# Patient Record
Sex: Female | Born: 1935 | Race: White | Hispanic: No | Smoking: Former smoker
Health system: Southern US, Community
[De-identification: ages and names within clinical notes are randomized; demographics above are authoritative.]

## PROBLEM LIST (undated history)

## (undated) DIAGNOSIS — M1990 Unspecified osteoarthritis, unspecified site: Secondary | ICD-10-CM

## (undated) DIAGNOSIS — F419 Anxiety disorder, unspecified: Secondary | ICD-10-CM

## (undated) DIAGNOSIS — R112 Nausea with vomiting, unspecified: Secondary | ICD-10-CM

## (undated) DIAGNOSIS — M810 Age-related osteoporosis without current pathological fracture: Secondary | ICD-10-CM

## (undated) DIAGNOSIS — Z9889 Other specified postprocedural states: Secondary | ICD-10-CM

## (undated) DIAGNOSIS — K219 Gastro-esophageal reflux disease without esophagitis: Secondary | ICD-10-CM

## (undated) DIAGNOSIS — I10 Essential (primary) hypertension: Secondary | ICD-10-CM

## (undated) DIAGNOSIS — N390 Urinary tract infection, site not specified: Secondary | ICD-10-CM

## (undated) DIAGNOSIS — D649 Anemia, unspecified: Secondary | ICD-10-CM

## (undated) DIAGNOSIS — F32A Depression, unspecified: Secondary | ICD-10-CM

## (undated) DIAGNOSIS — I480 Paroxysmal atrial fibrillation: Secondary | ICD-10-CM

## (undated) DIAGNOSIS — I340 Nonrheumatic mitral (valve) insufficiency: Secondary | ICD-10-CM

## (undated) DIAGNOSIS — F329 Major depressive disorder, single episode, unspecified: Secondary | ICD-10-CM

## (undated) DIAGNOSIS — K5792 Diverticulitis of intestine, part unspecified, without perforation or abscess without bleeding: Secondary | ICD-10-CM

## (undated) HISTORY — PX: CHOLECYSTECTOMY: SHX55

## (undated) HISTORY — PX: HERNIA REPAIR: SHX51

## (undated) HISTORY — PX: APPENDECTOMY: SHX54

## (undated) HISTORY — PX: KYPHOPLASTY: SHX5884

## (undated) HISTORY — PX: KNEE ARTHROSCOPY W/ MENISCAL REPAIR: SHX1877

## (undated) HISTORY — PX: COLONOSCOPY: SHX174

## (undated) HISTORY — PX: GALLBLADDER SURGERY: SHX652

## (undated) HISTORY — PX: ABDOMINAL HYSTERECTOMY: SHX81

---

## 2013-11-24 DIAGNOSIS — M4850XA Collapsed vertebra, not elsewhere classified, site unspecified, initial encounter for fracture: Secondary | ICD-10-CM | POA: Insufficient documentation

## 2014-04-21 DIAGNOSIS — Z8719 Personal history of other diseases of the digestive system: Secondary | ICD-10-CM | POA: Insufficient documentation

## 2017-01-02 DIAGNOSIS — F4321 Adjustment disorder with depressed mood: Secondary | ICD-10-CM | POA: Diagnosis not present

## 2017-01-02 DIAGNOSIS — F331 Major depressive disorder, recurrent, moderate: Secondary | ICD-10-CM | POA: Diagnosis not present

## 2017-01-09 DIAGNOSIS — F332 Major depressive disorder, recurrent severe without psychotic features: Secondary | ICD-10-CM | POA: Diagnosis not present

## 2017-01-09 DIAGNOSIS — F411 Generalized anxiety disorder: Secondary | ICD-10-CM | POA: Diagnosis not present

## 2017-01-10 DIAGNOSIS — R300 Dysuria: Secondary | ICD-10-CM | POA: Diagnosis not present

## 2017-01-10 DIAGNOSIS — N390 Urinary tract infection, site not specified: Secondary | ICD-10-CM | POA: Diagnosis not present

## 2017-01-10 DIAGNOSIS — G8929 Other chronic pain: Secondary | ICD-10-CM | POA: Diagnosis not present

## 2017-01-10 DIAGNOSIS — Z87448 Personal history of other diseases of urinary system: Secondary | ICD-10-CM | POA: Diagnosis not present

## 2017-01-10 DIAGNOSIS — M79674 Pain in right toe(s): Secondary | ICD-10-CM | POA: Diagnosis not present

## 2017-01-10 DIAGNOSIS — L84 Corns and callosities: Secondary | ICD-10-CM | POA: Diagnosis not present

## 2017-01-23 DIAGNOSIS — F4321 Adjustment disorder with depressed mood: Secondary | ICD-10-CM | POA: Diagnosis not present

## 2017-01-23 DIAGNOSIS — F331 Major depressive disorder, recurrent, moderate: Secondary | ICD-10-CM | POA: Diagnosis not present

## 2017-01-24 DIAGNOSIS — R102 Pelvic and perineal pain: Secondary | ICD-10-CM | POA: Diagnosis not present

## 2017-01-24 DIAGNOSIS — R300 Dysuria: Secondary | ICD-10-CM | POA: Diagnosis not present

## 2017-02-13 DIAGNOSIS — F411 Generalized anxiety disorder: Secondary | ICD-10-CM | POA: Diagnosis not present

## 2017-02-13 DIAGNOSIS — F332 Major depressive disorder, recurrent severe without psychotic features: Secondary | ICD-10-CM | POA: Diagnosis not present

## 2017-02-20 DIAGNOSIS — F332 Major depressive disorder, recurrent severe without psychotic features: Secondary | ICD-10-CM | POA: Diagnosis not present

## 2017-02-20 DIAGNOSIS — F411 Generalized anxiety disorder: Secondary | ICD-10-CM | POA: Diagnosis not present

## 2017-02-24 DIAGNOSIS — J208 Acute bronchitis due to other specified organisms: Secondary | ICD-10-CM | POA: Diagnosis not present

## 2017-02-24 DIAGNOSIS — N390 Urinary tract infection, site not specified: Secondary | ICD-10-CM | POA: Diagnosis not present

## 2017-02-24 DIAGNOSIS — Z87440 Personal history of urinary (tract) infections: Secondary | ICD-10-CM | POA: Diagnosis not present

## 2017-02-27 DIAGNOSIS — M2041 Other hammer toe(s) (acquired), right foot: Secondary | ICD-10-CM | POA: Diagnosis not present

## 2017-02-27 DIAGNOSIS — M79672 Pain in left foot: Secondary | ICD-10-CM | POA: Diagnosis not present

## 2017-02-27 DIAGNOSIS — M2042 Other hammer toe(s) (acquired), left foot: Secondary | ICD-10-CM | POA: Diagnosis not present

## 2017-02-27 DIAGNOSIS — L84 Corns and callosities: Secondary | ICD-10-CM | POA: Diagnosis not present

## 2017-03-02 DIAGNOSIS — T148XXA Other injury of unspecified body region, initial encounter: Secondary | ICD-10-CM | POA: Diagnosis not present

## 2017-03-02 DIAGNOSIS — R109 Unspecified abdominal pain: Secondary | ICD-10-CM | POA: Diagnosis not present

## 2017-03-02 DIAGNOSIS — M545 Low back pain: Secondary | ICD-10-CM | POA: Diagnosis not present

## 2017-03-03 DIAGNOSIS — M549 Dorsalgia, unspecified: Secondary | ICD-10-CM | POA: Diagnosis not present

## 2017-03-03 DIAGNOSIS — R05 Cough: Secondary | ICD-10-CM | POA: Diagnosis not present

## 2017-03-03 DIAGNOSIS — M546 Pain in thoracic spine: Secondary | ICD-10-CM | POA: Diagnosis not present

## 2017-03-11 DIAGNOSIS — I722 Aneurysm of renal artery: Secondary | ICD-10-CM | POA: Diagnosis not present

## 2017-03-11 DIAGNOSIS — R1084 Generalized abdominal pain: Secondary | ICD-10-CM | POA: Diagnosis not present

## 2017-03-11 DIAGNOSIS — R300 Dysuria: Secondary | ICD-10-CM | POA: Diagnosis not present

## 2017-03-11 DIAGNOSIS — R071 Chest pain on breathing: Secondary | ICD-10-CM | POA: Diagnosis not present

## 2017-03-11 DIAGNOSIS — M546 Pain in thoracic spine: Secondary | ICD-10-CM | POA: Diagnosis not present

## 2017-03-13 DIAGNOSIS — F331 Major depressive disorder, recurrent, moderate: Secondary | ICD-10-CM | POA: Diagnosis not present

## 2017-03-13 DIAGNOSIS — F4321 Adjustment disorder with depressed mood: Secondary | ICD-10-CM | POA: Diagnosis not present

## 2017-03-17 DIAGNOSIS — K2970 Gastritis, unspecified, without bleeding: Secondary | ICD-10-CM | POA: Diagnosis not present

## 2017-03-17 DIAGNOSIS — D649 Anemia, unspecified: Secondary | ICD-10-CM | POA: Diagnosis not present

## 2017-03-17 DIAGNOSIS — D500 Iron deficiency anemia secondary to blood loss (chronic): Secondary | ICD-10-CM | POA: Diagnosis not present

## 2017-03-17 DIAGNOSIS — K2950 Unspecified chronic gastritis without bleeding: Secondary | ICD-10-CM | POA: Diagnosis not present

## 2017-03-17 DIAGNOSIS — K2960 Other gastritis without bleeding: Secondary | ICD-10-CM | POA: Diagnosis not present

## 2017-03-17 DIAGNOSIS — K219 Gastro-esophageal reflux disease without esophagitis: Secondary | ICD-10-CM | POA: Diagnosis not present

## 2017-03-17 DIAGNOSIS — K449 Diaphragmatic hernia without obstruction or gangrene: Secondary | ICD-10-CM | POA: Diagnosis not present

## 2017-03-17 DIAGNOSIS — R109 Unspecified abdominal pain: Secondary | ICD-10-CM | POA: Diagnosis not present

## 2017-03-17 DIAGNOSIS — E785 Hyperlipidemia, unspecified: Secondary | ICD-10-CM | POA: Diagnosis not present

## 2017-03-17 DIAGNOSIS — R1084 Generalized abdominal pain: Secondary | ICD-10-CM | POA: Diagnosis not present

## 2017-03-20 DIAGNOSIS — S22060A Wedge compression fracture of T7-T8 vertebra, initial encounter for closed fracture: Secondary | ICD-10-CM | POA: Diagnosis not present

## 2017-03-21 DIAGNOSIS — S22060A Wedge compression fracture of T7-T8 vertebra, initial encounter for closed fracture: Secondary | ICD-10-CM | POA: Diagnosis not present

## 2017-03-21 DIAGNOSIS — M4854XA Collapsed vertebra, not elsewhere classified, thoracic region, initial encounter for fracture: Secondary | ICD-10-CM | POA: Diagnosis not present

## 2017-03-24 DIAGNOSIS — N189 Chronic kidney disease, unspecified: Secondary | ICD-10-CM | POA: Diagnosis not present

## 2017-03-24 DIAGNOSIS — D649 Anemia, unspecified: Secondary | ICD-10-CM | POA: Diagnosis not present

## 2017-03-24 DIAGNOSIS — I129 Hypertensive chronic kidney disease with stage 1 through stage 4 chronic kidney disease, or unspecified chronic kidney disease: Secondary | ICD-10-CM | POA: Diagnosis not present

## 2017-03-24 DIAGNOSIS — D631 Anemia in chronic kidney disease: Secondary | ICD-10-CM | POA: Diagnosis not present

## 2017-03-24 DIAGNOSIS — M8000XD Age-related osteoporosis with current pathological fracture, unspecified site, subsequent encounter for fracture with routine healing: Secondary | ICD-10-CM | POA: Insufficient documentation

## 2017-03-24 DIAGNOSIS — M8008XA Age-related osteoporosis with current pathological fracture, vertebra(e), initial encounter for fracture: Secondary | ICD-10-CM | POA: Diagnosis not present

## 2017-03-24 DIAGNOSIS — E785 Hyperlipidemia, unspecified: Secondary | ICD-10-CM | POA: Diagnosis not present

## 2017-03-24 DIAGNOSIS — Z87891 Personal history of nicotine dependence: Secondary | ICD-10-CM | POA: Diagnosis not present

## 2017-03-28 DIAGNOSIS — R0781 Pleurodynia: Secondary | ICD-10-CM | POA: Diagnosis not present

## 2017-03-28 DIAGNOSIS — J189 Pneumonia, unspecified organism: Secondary | ICD-10-CM | POA: Diagnosis not present

## 2017-04-07 DIAGNOSIS — F329 Major depressive disorder, single episode, unspecified: Secondary | ICD-10-CM | POA: Diagnosis not present

## 2017-04-07 DIAGNOSIS — Z9049 Acquired absence of other specified parts of digestive tract: Secondary | ICD-10-CM | POA: Diagnosis not present

## 2017-04-07 DIAGNOSIS — F419 Anxiety disorder, unspecified: Secondary | ICD-10-CM | POA: Diagnosis not present

## 2017-04-07 DIAGNOSIS — M19012 Primary osteoarthritis, left shoulder: Secondary | ICD-10-CM | POA: Diagnosis not present

## 2017-04-07 DIAGNOSIS — M1990 Unspecified osteoarthritis, unspecified site: Secondary | ICD-10-CM | POA: Diagnosis not present

## 2017-04-07 DIAGNOSIS — S46912A Strain of unspecified muscle, fascia and tendon at shoulder and upper arm level, left arm, initial encounter: Secondary | ICD-10-CM | POA: Diagnosis not present

## 2017-04-07 DIAGNOSIS — M1711 Unilateral primary osteoarthritis, right knee: Secondary | ICD-10-CM | POA: Diagnosis not present

## 2017-04-07 DIAGNOSIS — Z90710 Acquired absence of both cervix and uterus: Secondary | ICD-10-CM | POA: Diagnosis not present

## 2017-04-07 DIAGNOSIS — S8011XA Contusion of right lower leg, initial encounter: Secondary | ICD-10-CM | POA: Diagnosis not present

## 2017-04-07 DIAGNOSIS — D649 Anemia, unspecified: Secondary | ICD-10-CM | POA: Diagnosis not present

## 2017-04-07 DIAGNOSIS — Z79899 Other long term (current) drug therapy: Secondary | ICD-10-CM | POA: Diagnosis not present

## 2017-04-07 DIAGNOSIS — S80211A Abrasion, right knee, initial encounter: Secondary | ICD-10-CM | POA: Diagnosis not present

## 2017-04-07 DIAGNOSIS — S80811A Abrasion, right lower leg, initial encounter: Secondary | ICD-10-CM | POA: Diagnosis not present

## 2017-04-07 DIAGNOSIS — Z79891 Long term (current) use of opiate analgesic: Secondary | ICD-10-CM | POA: Diagnosis not present

## 2017-04-07 DIAGNOSIS — K219 Gastro-esophageal reflux disease without esophagitis: Secondary | ICD-10-CM | POA: Diagnosis not present

## 2017-04-07 DIAGNOSIS — Z9089 Acquired absence of other organs: Secondary | ICD-10-CM | POA: Diagnosis not present

## 2017-04-07 DIAGNOSIS — Z87891 Personal history of nicotine dependence: Secondary | ICD-10-CM | POA: Diagnosis not present

## 2017-04-07 DIAGNOSIS — W19XXXA Unspecified fall, initial encounter: Secondary | ICD-10-CM | POA: Diagnosis not present

## 2017-04-10 DIAGNOSIS — F331 Major depressive disorder, recurrent, moderate: Secondary | ICD-10-CM | POA: Diagnosis not present

## 2017-04-10 DIAGNOSIS — F4321 Adjustment disorder with depressed mood: Secondary | ICD-10-CM | POA: Diagnosis not present

## 2017-04-14 DIAGNOSIS — Z842 Family history of other diseases of the genitourinary system: Secondary | ICD-10-CM | POA: Diagnosis not present

## 2017-04-14 DIAGNOSIS — R0602 Shortness of breath: Secondary | ICD-10-CM | POA: Diagnosis not present

## 2017-04-14 DIAGNOSIS — K449 Diaphragmatic hernia without obstruction or gangrene: Secondary | ICD-10-CM | POA: Diagnosis not present

## 2017-04-14 DIAGNOSIS — D509 Iron deficiency anemia, unspecified: Secondary | ICD-10-CM | POA: Diagnosis present

## 2017-04-14 DIAGNOSIS — I313 Pericardial effusion (noninflammatory): Secondary | ICD-10-CM | POA: Diagnosis not present

## 2017-04-14 DIAGNOSIS — F419 Anxiety disorder, unspecified: Secondary | ICD-10-CM | POA: Diagnosis present

## 2017-04-14 DIAGNOSIS — Z87440 Personal history of urinary (tract) infections: Secondary | ICD-10-CM | POA: Diagnosis not present

## 2017-04-14 DIAGNOSIS — I4891 Unspecified atrial fibrillation: Secondary | ICD-10-CM | POA: Diagnosis not present

## 2017-04-14 DIAGNOSIS — Z8249 Family history of ischemic heart disease and other diseases of the circulatory system: Secondary | ICD-10-CM | POA: Diagnosis not present

## 2017-04-14 DIAGNOSIS — Z9181 History of falling: Secondary | ICD-10-CM | POA: Diagnosis not present

## 2017-04-14 DIAGNOSIS — I517 Cardiomegaly: Secondary | ICD-10-CM | POA: Diagnosis not present

## 2017-04-14 DIAGNOSIS — Z87891 Personal history of nicotine dependence: Secondary | ICD-10-CM | POA: Diagnosis not present

## 2017-04-14 DIAGNOSIS — D539 Nutritional anemia, unspecified: Secondary | ICD-10-CM | POA: Diagnosis not present

## 2017-04-14 DIAGNOSIS — R0789 Other chest pain: Secondary | ICD-10-CM | POA: Diagnosis not present

## 2017-04-14 DIAGNOSIS — I10 Essential (primary) hypertension: Secondary | ICD-10-CM | POA: Insufficient documentation

## 2017-04-14 DIAGNOSIS — F329 Major depressive disorder, single episode, unspecified: Secondary | ICD-10-CM | POA: Diagnosis not present

## 2017-04-14 DIAGNOSIS — R000 Tachycardia, unspecified: Secondary | ICD-10-CM | POA: Diagnosis not present

## 2017-04-14 DIAGNOSIS — I480 Paroxysmal atrial fibrillation: Secondary | ICD-10-CM | POA: Diagnosis present

## 2017-04-14 DIAGNOSIS — R410 Disorientation, unspecified: Secondary | ICD-10-CM | POA: Diagnosis not present

## 2017-04-14 DIAGNOSIS — R109 Unspecified abdominal pain: Secondary | ICD-10-CM | POA: Diagnosis not present

## 2017-04-14 DIAGNOSIS — M791 Myalgia: Secondary | ICD-10-CM | POA: Diagnosis present

## 2017-04-14 DIAGNOSIS — I083 Combined rheumatic disorders of mitral, aortic and tricuspid valves: Secondary | ICD-10-CM | POA: Diagnosis not present

## 2017-04-14 DIAGNOSIS — J810 Acute pulmonary edema: Secondary | ICD-10-CM | POA: Diagnosis present

## 2017-04-14 DIAGNOSIS — I9589 Other hypotension: Secondary | ICD-10-CM | POA: Diagnosis present

## 2017-04-14 DIAGNOSIS — I5189 Other ill-defined heart diseases: Secondary | ICD-10-CM | POA: Diagnosis present

## 2017-04-14 DIAGNOSIS — R300 Dysuria: Secondary | ICD-10-CM | POA: Diagnosis present

## 2017-04-14 DIAGNOSIS — N390 Urinary tract infection, site not specified: Secondary | ICD-10-CM | POA: Diagnosis not present

## 2017-04-14 DIAGNOSIS — E8770 Fluid overload, unspecified: Secondary | ICD-10-CM | POA: Diagnosis present

## 2017-04-14 DIAGNOSIS — J90 Pleural effusion, not elsewhere classified: Secondary | ICD-10-CM | POA: Diagnosis not present

## 2017-04-14 DIAGNOSIS — J9601 Acute respiratory failure with hypoxia: Secondary | ICD-10-CM | POA: Diagnosis not present

## 2017-04-14 DIAGNOSIS — K5730 Diverticulosis of large intestine without perforation or abscess without bleeding: Secondary | ICD-10-CM | POA: Diagnosis not present

## 2017-04-14 DIAGNOSIS — K219 Gastro-esophageal reflux disease without esophagitis: Secondary | ICD-10-CM | POA: Diagnosis present

## 2017-04-14 DIAGNOSIS — I77819 Aortic ectasia, unspecified site: Secondary | ICD-10-CM | POA: Diagnosis not present

## 2017-04-14 DIAGNOSIS — I519 Heart disease, unspecified: Secondary | ICD-10-CM | POA: Diagnosis not present

## 2017-04-14 DIAGNOSIS — J9811 Atelectasis: Secondary | ICD-10-CM | POA: Diagnosis not present

## 2017-04-14 DIAGNOSIS — S80811A Abrasion, right lower leg, initial encounter: Secondary | ICD-10-CM | POA: Diagnosis present

## 2017-04-14 DIAGNOSIS — I340 Nonrheumatic mitral (valve) insufficiency: Secondary | ICD-10-CM | POA: Diagnosis present

## 2017-04-14 DIAGNOSIS — I959 Hypotension, unspecified: Secondary | ICD-10-CM | POA: Diagnosis not present

## 2017-04-20 DIAGNOSIS — M6281 Muscle weakness (generalized): Secondary | ICD-10-CM | POA: Diagnosis not present

## 2017-04-20 DIAGNOSIS — I480 Paroxysmal atrial fibrillation: Secondary | ICD-10-CM | POA: Diagnosis not present

## 2017-04-20 DIAGNOSIS — M1990 Unspecified osteoarthritis, unspecified site: Secondary | ICD-10-CM | POA: Diagnosis not present

## 2017-04-20 DIAGNOSIS — D649 Anemia, unspecified: Secondary | ICD-10-CM | POA: Diagnosis not present

## 2017-04-20 DIAGNOSIS — R2681 Unsteadiness on feet: Secondary | ICD-10-CM | POA: Diagnosis not present

## 2017-04-20 DIAGNOSIS — F329 Major depressive disorder, single episode, unspecified: Secondary | ICD-10-CM | POA: Diagnosis not present

## 2017-04-25 DIAGNOSIS — D649 Anemia, unspecified: Secondary | ICD-10-CM | POA: Diagnosis not present

## 2017-04-25 DIAGNOSIS — M6281 Muscle weakness (generalized): Secondary | ICD-10-CM | POA: Diagnosis not present

## 2017-04-25 DIAGNOSIS — M1990 Unspecified osteoarthritis, unspecified site: Secondary | ICD-10-CM | POA: Diagnosis not present

## 2017-04-25 DIAGNOSIS — F329 Major depressive disorder, single episode, unspecified: Secondary | ICD-10-CM | POA: Diagnosis not present

## 2017-04-25 DIAGNOSIS — I480 Paroxysmal atrial fibrillation: Secondary | ICD-10-CM | POA: Diagnosis not present

## 2017-04-25 DIAGNOSIS — R2681 Unsteadiness on feet: Secondary | ICD-10-CM | POA: Diagnosis not present

## 2017-04-30 DIAGNOSIS — M6281 Muscle weakness (generalized): Secondary | ICD-10-CM | POA: Diagnosis not present

## 2017-04-30 DIAGNOSIS — F329 Major depressive disorder, single episode, unspecified: Secondary | ICD-10-CM | POA: Diagnosis not present

## 2017-04-30 DIAGNOSIS — I480 Paroxysmal atrial fibrillation: Secondary | ICD-10-CM | POA: Diagnosis not present

## 2017-04-30 DIAGNOSIS — M1990 Unspecified osteoarthritis, unspecified site: Secondary | ICD-10-CM | POA: Diagnosis not present

## 2017-04-30 DIAGNOSIS — D649 Anemia, unspecified: Secondary | ICD-10-CM | POA: Diagnosis not present

## 2017-04-30 DIAGNOSIS — R2681 Unsteadiness on feet: Secondary | ICD-10-CM | POA: Diagnosis not present

## 2017-05-01 DIAGNOSIS — F332 Major depressive disorder, recurrent severe without psychotic features: Secondary | ICD-10-CM | POA: Diagnosis not present

## 2017-05-01 DIAGNOSIS — F411 Generalized anxiety disorder: Secondary | ICD-10-CM | POA: Diagnosis not present

## 2017-05-02 DIAGNOSIS — F329 Major depressive disorder, single episode, unspecified: Secondary | ICD-10-CM | POA: Diagnosis not present

## 2017-05-02 DIAGNOSIS — M6281 Muscle weakness (generalized): Secondary | ICD-10-CM | POA: Diagnosis not present

## 2017-05-02 DIAGNOSIS — M1990 Unspecified osteoarthritis, unspecified site: Secondary | ICD-10-CM | POA: Diagnosis not present

## 2017-05-02 DIAGNOSIS — D649 Anemia, unspecified: Secondary | ICD-10-CM | POA: Diagnosis not present

## 2017-05-02 DIAGNOSIS — I480 Paroxysmal atrial fibrillation: Secondary | ICD-10-CM | POA: Diagnosis not present

## 2017-05-02 DIAGNOSIS — R2681 Unsteadiness on feet: Secondary | ICD-10-CM | POA: Diagnosis not present

## 2017-05-06 DIAGNOSIS — R195 Other fecal abnormalities: Secondary | ICD-10-CM | POA: Diagnosis not present

## 2017-05-06 DIAGNOSIS — D500 Iron deficiency anemia secondary to blood loss (chronic): Secondary | ICD-10-CM | POA: Diagnosis not present

## 2017-05-06 DIAGNOSIS — R634 Abnormal weight loss: Secondary | ICD-10-CM | POA: Diagnosis not present

## 2017-05-06 DIAGNOSIS — Z09 Encounter for follow-up examination after completed treatment for conditions other than malignant neoplasm: Secondary | ICD-10-CM | POA: Diagnosis not present

## 2017-05-06 DIAGNOSIS — R7989 Other specified abnormal findings of blood chemistry: Secondary | ICD-10-CM | POA: Diagnosis not present

## 2017-05-08 DIAGNOSIS — F332 Major depressive disorder, recurrent severe without psychotic features: Secondary | ICD-10-CM | POA: Diagnosis not present

## 2017-05-09 DIAGNOSIS — I480 Paroxysmal atrial fibrillation: Secondary | ICD-10-CM | POA: Diagnosis not present

## 2017-05-09 DIAGNOSIS — F329 Major depressive disorder, single episode, unspecified: Secondary | ICD-10-CM | POA: Diagnosis not present

## 2017-05-09 DIAGNOSIS — M6281 Muscle weakness (generalized): Secondary | ICD-10-CM | POA: Diagnosis not present

## 2017-05-09 DIAGNOSIS — D649 Anemia, unspecified: Secondary | ICD-10-CM | POA: Diagnosis not present

## 2017-05-09 DIAGNOSIS — M1990 Unspecified osteoarthritis, unspecified site: Secondary | ICD-10-CM | POA: Diagnosis not present

## 2017-05-09 DIAGNOSIS — R2681 Unsteadiness on feet: Secondary | ICD-10-CM | POA: Diagnosis not present

## 2017-05-13 DIAGNOSIS — I480 Paroxysmal atrial fibrillation: Secondary | ICD-10-CM | POA: Diagnosis not present

## 2017-05-13 DIAGNOSIS — D649 Anemia, unspecified: Secondary | ICD-10-CM | POA: Diagnosis not present

## 2017-05-13 DIAGNOSIS — F329 Major depressive disorder, single episode, unspecified: Secondary | ICD-10-CM | POA: Diagnosis not present

## 2017-05-13 DIAGNOSIS — M6281 Muscle weakness (generalized): Secondary | ICD-10-CM | POA: Diagnosis not present

## 2017-05-13 DIAGNOSIS — R2681 Unsteadiness on feet: Secondary | ICD-10-CM | POA: Diagnosis not present

## 2017-05-13 DIAGNOSIS — M1990 Unspecified osteoarthritis, unspecified site: Secondary | ICD-10-CM | POA: Diagnosis not present

## 2017-05-15 DIAGNOSIS — F411 Generalized anxiety disorder: Secondary | ICD-10-CM | POA: Diagnosis not present

## 2017-05-15 DIAGNOSIS — F332 Major depressive disorder, recurrent severe without psychotic features: Secondary | ICD-10-CM | POA: Diagnosis not present

## 2017-05-16 DIAGNOSIS — I480 Paroxysmal atrial fibrillation: Secondary | ICD-10-CM | POA: Diagnosis not present

## 2017-05-16 DIAGNOSIS — F329 Major depressive disorder, single episode, unspecified: Secondary | ICD-10-CM | POA: Diagnosis not present

## 2017-05-16 DIAGNOSIS — M6281 Muscle weakness (generalized): Secondary | ICD-10-CM | POA: Diagnosis not present

## 2017-05-16 DIAGNOSIS — D649 Anemia, unspecified: Secondary | ICD-10-CM | POA: Diagnosis not present

## 2017-05-16 DIAGNOSIS — M1990 Unspecified osteoarthritis, unspecified site: Secondary | ICD-10-CM | POA: Diagnosis not present

## 2017-05-16 DIAGNOSIS — R2681 Unsteadiness on feet: Secondary | ICD-10-CM | POA: Diagnosis not present

## 2017-05-19 DIAGNOSIS — F329 Major depressive disorder, single episode, unspecified: Secondary | ICD-10-CM | POA: Diagnosis not present

## 2017-05-19 DIAGNOSIS — M1990 Unspecified osteoarthritis, unspecified site: Secondary | ICD-10-CM | POA: Diagnosis not present

## 2017-05-19 DIAGNOSIS — I480 Paroxysmal atrial fibrillation: Secondary | ICD-10-CM | POA: Diagnosis not present

## 2017-05-19 DIAGNOSIS — M6281 Muscle weakness (generalized): Secondary | ICD-10-CM | POA: Diagnosis not present

## 2017-05-19 DIAGNOSIS — R2681 Unsteadiness on feet: Secondary | ICD-10-CM | POA: Diagnosis not present

## 2017-05-19 DIAGNOSIS — D649 Anemia, unspecified: Secondary | ICD-10-CM | POA: Diagnosis not present

## 2017-05-22 DIAGNOSIS — F411 Generalized anxiety disorder: Secondary | ICD-10-CM | POA: Diagnosis not present

## 2017-05-22 DIAGNOSIS — F332 Major depressive disorder, recurrent severe without psychotic features: Secondary | ICD-10-CM | POA: Diagnosis not present

## 2017-05-23 DIAGNOSIS — M6281 Muscle weakness (generalized): Secondary | ICD-10-CM | POA: Diagnosis not present

## 2017-05-23 DIAGNOSIS — R2681 Unsteadiness on feet: Secondary | ICD-10-CM | POA: Diagnosis not present

## 2017-05-23 DIAGNOSIS — M1990 Unspecified osteoarthritis, unspecified site: Secondary | ICD-10-CM | POA: Diagnosis not present

## 2017-05-23 DIAGNOSIS — I480 Paroxysmal atrial fibrillation: Secondary | ICD-10-CM | POA: Diagnosis not present

## 2017-05-23 DIAGNOSIS — F329 Major depressive disorder, single episode, unspecified: Secondary | ICD-10-CM | POA: Diagnosis not present

## 2017-05-23 DIAGNOSIS — D649 Anemia, unspecified: Secondary | ICD-10-CM | POA: Diagnosis not present

## 2017-05-28 DIAGNOSIS — R21 Rash and other nonspecific skin eruption: Secondary | ICD-10-CM | POA: Diagnosis not present

## 2017-06-05 DIAGNOSIS — F411 Generalized anxiety disorder: Secondary | ICD-10-CM | POA: Diagnosis not present

## 2017-06-05 DIAGNOSIS — F332 Major depressive disorder, recurrent severe without psychotic features: Secondary | ICD-10-CM | POA: Diagnosis not present

## 2017-06-12 DIAGNOSIS — F332 Major depressive disorder, recurrent severe without psychotic features: Secondary | ICD-10-CM | POA: Diagnosis not present

## 2017-06-12 DIAGNOSIS — F411 Generalized anxiety disorder: Secondary | ICD-10-CM | POA: Diagnosis not present

## 2017-06-19 DIAGNOSIS — F332 Major depressive disorder, recurrent severe without psychotic features: Secondary | ICD-10-CM | POA: Diagnosis not present

## 2017-06-19 DIAGNOSIS — F411 Generalized anxiety disorder: Secondary | ICD-10-CM | POA: Diagnosis not present

## 2017-06-26 DIAGNOSIS — F332 Major depressive disorder, recurrent severe without psychotic features: Secondary | ICD-10-CM | POA: Diagnosis not present

## 2017-06-26 DIAGNOSIS — F411 Generalized anxiety disorder: Secondary | ICD-10-CM | POA: Diagnosis not present

## 2017-07-10 DIAGNOSIS — F332 Major depressive disorder, recurrent severe without psychotic features: Secondary | ICD-10-CM | POA: Diagnosis not present

## 2017-07-10 DIAGNOSIS — F411 Generalized anxiety disorder: Secondary | ICD-10-CM | POA: Diagnosis not present

## 2017-07-22 DIAGNOSIS — M25552 Pain in left hip: Secondary | ICD-10-CM | POA: Diagnosis not present

## 2017-07-22 DIAGNOSIS — M533 Sacrococcygeal disorders, not elsewhere classified: Secondary | ICD-10-CM | POA: Diagnosis not present

## 2017-07-24 DIAGNOSIS — M533 Sacrococcygeal disorders, not elsewhere classified: Secondary | ICD-10-CM | POA: Diagnosis not present

## 2017-07-24 DIAGNOSIS — M25451 Effusion, right hip: Secondary | ICD-10-CM | POA: Diagnosis not present

## 2017-07-24 DIAGNOSIS — M25452 Effusion, left hip: Secondary | ICD-10-CM | POA: Diagnosis not present

## 2017-07-24 DIAGNOSIS — M791 Myalgia: Secondary | ICD-10-CM | POA: Diagnosis not present

## 2017-07-28 DIAGNOSIS — F418 Other specified anxiety disorders: Secondary | ICD-10-CM | POA: Diagnosis not present

## 2017-07-28 DIAGNOSIS — M533 Sacrococcygeal disorders, not elsewhere classified: Secondary | ICD-10-CM | POA: Diagnosis not present

## 2017-07-28 DIAGNOSIS — I10 Essential (primary) hypertension: Secondary | ICD-10-CM | POA: Diagnosis not present

## 2017-08-11 ENCOUNTER — Encounter: Payer: Self-pay | Admitting: Sports Medicine

## 2017-08-11 ENCOUNTER — Ambulatory Visit (INDEPENDENT_AMBULATORY_CARE_PROVIDER_SITE_OTHER): Payer: Medicare Other | Admitting: Sports Medicine

## 2017-08-11 DIAGNOSIS — G8929 Other chronic pain: Secondary | ICD-10-CM

## 2017-08-11 DIAGNOSIS — M545 Low back pain: Secondary | ICD-10-CM

## 2017-08-11 DIAGNOSIS — M48061 Spinal stenosis, lumbar region without neurogenic claudication: Secondary | ICD-10-CM | POA: Insufficient documentation

## 2017-08-11 DIAGNOSIS — E1321 Other specified diabetes mellitus with diabetic nephropathy: Secondary | ICD-10-CM | POA: Diagnosis not present

## 2017-08-11 DIAGNOSIS — D649 Anemia, unspecified: Secondary | ICD-10-CM

## 2017-08-11 MED ORDER — MELOXICAM 15 MG PO TABS: ORAL_TABLET | ORAL | 3 refills | Status: DC

## 2017-08-11 NOTE — Progress Notes (Signed)
   Subjective:    I'm seeing this patient as a consultation for:  Earley Brooke PA-C  CC: Left buttock pain  HPI: This is a pleasant 83 year old female, for months she's had pain in her left lower buttock. Does have a history of osteoporosis and had several vertebral compression fractures, her PCP obtain an MRI of the pelvis which was negative with the exception of seeing a hint of lumbar spinal stenosis as well as a nonspecific cyst in the femoral head/neck junction. Pain is worse with standing, walking. Localized over the SI joint with radiation into the buttock but no pain over the ischial tuberosity. Nothing overtly radicular. No bowel or bladder dysfunction that are new, no saddle numbness or constitutional symptoms.  On review of her chart she does seem to have some fairly severe anemia, normocytic, she is on iron.  Past medical history, Surgical history, Family history not pertinant except as noted below, Social history, Allergies, and medications have been entered into the medical record, reviewed, and no changes needed.   Review of Systems: No headache, visual changes, nausea, vomiting, diarrhea, constipation, dizziness, abdominal pain, skin rash, fevers, chills, night sweats, weight loss, swollen lymph nodes, body aches, joint swelling, muscle aches, chest pain, shortness of breath, mood changes, visual or auditory hallucinations.   Objective:    General: Well Developed, well nourished, and in no acute distress.  Neuro:  Extra-ocular muscles intact, able to move all 4 extremities, sensation grossly intact.  Deep tendon reflexes tested were normal. Psych: Alert and oriented, mood congruent with affect. ENT:  Ears and nose appear unremarkable.  Hearing grossly normal. Neck: Unremarkable overall appearance, trachea midline.  No visible thyroid enlargement. Eyes: Conjunctivae and lids appear unremarkable.  Pupils equal and round. Skin: Warm and dry, no rashes noted.  Cardiovascular:  Pulses palpable, no extremity edema. Back Exam:  Inspection: Unremarkable  Motion: Flexion 45 deg, Extension 45 deg, Side Bending to 45 deg bilaterally,  Rotation to 45 deg bilaterally  SLR laying: Negative  XSLR laying: Negative  Palpable tenderness: Left sacroiliac joint, no tenderness over the ischial tuberosity. FABER: negative. Sensory change: Gross sensation intact to all lumbar and sacral dermatomes.  Reflexes: 2+ at both patellar tendons, 2+ at achilles tendons, Babinski's downgoing.  Strength at foot  Plantar-flexion: 5/5 Dorsi-flexion: 5/5 Eversion: 5/5 Inversion: 5/5  Leg strength  Quad: 5/5 Hamstring: 5/5 Hip flexor: 5/5 Hip abductors: 5/5  Gait unremarkable.  Impression and Recommendations:   This case required medical decision making of moderate complexity.  Left low back pain Pelvic MRI by another provider was negative for the most part. There was some lumbar spinal stenosis in the lower lumbar spine. Pain is predominantly around the left sacroiliac joint. She also has some pain consistent with spinal stenosis type pain. Has not done formal physical therapy or taking an NSAID. Adding meloxicam, home health PT, return in one month, SI joint injection if no better. If still no improvement we will image her lumbar spine. She also has fairly severe anemia which I think is likely contributory.  Normocytic anemia Unclear etiology, she is taking iron but she does have a normal MCV. Rechecking CBC, anemia panel, reticulocyte counts.

## 2017-08-11 NOTE — Assessment & Plan Note (Addendum)
Pelvic MRI by another provider was negative for the most part. There was some lumbar spinal stenosis in the lower lumbar spine. Pain is predominantly around the left sacroiliac joint. She also has some pain consistent with spinal stenosis type pain. Has not done formal physical therapy or taking an NSAID. Adding meloxicam, home health PT, return in one month, SI joint injection if no better. If still no improvement we will image her lumbar spine. She also has fairly severe anemia which I think is likely contributory.

## 2017-08-11 NOTE — Assessment & Plan Note (Signed)
Unclear etiology, she is taking iron but she does have a normal MCV. Rechecking CBC, anemia panel, reticulocyte counts.

## 2017-08-12 DIAGNOSIS — R300 Dysuria: Secondary | ICD-10-CM | POA: Diagnosis not present

## 2017-08-12 DIAGNOSIS — R05 Cough: Secondary | ICD-10-CM | POA: Diagnosis not present

## 2017-08-12 LAB — CBC
HCT: 36.5 % (ref 35.0–45.0)
Hemoglobin: 12.3 g/dL (ref 11.7–15.5)
MCH: 28.9 pg (ref 27.0–33.0)
MCHC: 33.7 g/dL (ref 32.0–36.0)
MCV: 85.7 fL (ref 80.0–100.0)
MPV: 9.3 fL (ref 7.5–12.5)
Platelets: 298 K/uL (ref 140–400)
RBC: 4.26 MIL/uL (ref 3.80–5.10)
RDW: 15.7 % — ABNORMAL HIGH (ref 11.0–15.0)
WBC: 6.9 K/uL (ref 3.8–10.8)

## 2017-08-12 LAB — FERRITIN: Ferritin: 66 ng/mL (ref 20–288)

## 2017-08-12 LAB — FOLATE: Folate: 14.6 ng/mL (ref >5.4)

## 2017-08-12 LAB — IRON AND TIBC
%SAT: 19 % (ref 11–50)
Iron: 56 ug/dL (ref 45–160)
TIBC: 299 ug/dL (ref 250–450)
UIBC: 243 ug/dL

## 2017-08-12 LAB — VITAMIN B12: Vitamin B-12: 892 pg/mL (ref 200–1100)

## 2017-08-12 LAB — RETICULOCYTES
ABS Retic: 34080 {cells}/uL (ref 20000–80000)
RBC.: 4.26 MIL/uL (ref 3.80–5.10)
Retic Ct Pct: 0.8 %

## 2017-08-15 ENCOUNTER — Encounter: Payer: Self-pay | Admitting: Sports Medicine

## 2017-08-21 DIAGNOSIS — F411 Generalized anxiety disorder: Secondary | ICD-10-CM | POA: Diagnosis not present

## 2017-08-21 DIAGNOSIS — F332 Major depressive disorder, recurrent severe without psychotic features: Secondary | ICD-10-CM | POA: Diagnosis not present

## 2017-08-24 DIAGNOSIS — R350 Frequency of micturition: Secondary | ICD-10-CM | POA: Diagnosis not present

## 2017-08-28 DIAGNOSIS — F411 Generalized anxiety disorder: Secondary | ICD-10-CM | POA: Diagnosis not present

## 2017-08-28 DIAGNOSIS — F332 Major depressive disorder, recurrent severe without psychotic features: Secondary | ICD-10-CM | POA: Diagnosis not present

## 2017-09-04 DIAGNOSIS — F411 Generalized anxiety disorder: Secondary | ICD-10-CM | POA: Diagnosis not present

## 2017-09-04 DIAGNOSIS — F332 Major depressive disorder, recurrent severe without psychotic features: Secondary | ICD-10-CM | POA: Diagnosis not present

## 2017-09-06 DIAGNOSIS — N3001 Acute cystitis with hematuria: Secondary | ICD-10-CM | POA: Diagnosis not present

## 2017-09-08 ENCOUNTER — Ambulatory Visit (INDEPENDENT_AMBULATORY_CARE_PROVIDER_SITE_OTHER): Payer: Medicare Other

## 2017-09-08 ENCOUNTER — Ambulatory Visit (INDEPENDENT_AMBULATORY_CARE_PROVIDER_SITE_OTHER): Payer: Medicare Other | Admitting: Sports Medicine

## 2017-09-08 ENCOUNTER — Encounter: Payer: Self-pay | Admitting: Sports Medicine

## 2017-09-08 VITALS — BP 137/67 | HR 69 | Wt 141.0 lb

## 2017-09-08 DIAGNOSIS — G8929 Other chronic pain: Secondary | ICD-10-CM | POA: Diagnosis not present

## 2017-09-08 DIAGNOSIS — M545 Low back pain, unspecified: Secondary | ICD-10-CM

## 2017-09-08 DIAGNOSIS — Z23 Encounter for immunization: Secondary | ICD-10-CM

## 2017-09-08 MED ORDER — PREDNISONE 50 MG PO TABS: ORAL_TABLET | ORAL | 0 refills | Status: DC

## 2017-09-08 NOTE — Assessment & Plan Note (Signed)
Pain is more evolving to be radicular and discogenic.  Pelvic MRI done by another provider was negative. Suspect spinal stenosis related pain. [facility] seemed to be somewhat of a cursory evaluation and told her she didn't need anything.  We're going to find a different physical therapy outfit, she'll let me know. Until then any updated x-rays of her lumbar spine as well as do 5 days of prednisone. Return to see me one month after she starts PT to reevaluate.

## 2017-09-08 NOTE — Progress Notes (Signed)
  Subjective:    CC: Follow-up  HPI: This is a pleasant 83 year old female, she was having some back pain, left-sided, now referring down to the left leg, sometimes on the right all the way to the toes. I want her to do home health physical therapy, it seems as though they came out, evaluated her and told her she didn't need any therapy whatsoever. She does have a negative pelvic MRI. Unfortunately her pain is the same. Not surprisingly.  Past medical history:  Negative.  See flowsheet/record as well for more information.  Surgical history: Negative.  See flowsheet/record as well for more information.  Family history: Negative.  See flowsheet/record as well for more information.  Social history: Negative.  See flowsheet/record as well for more information.  Allergies, and medications have been entered into the medical record, reviewed, and no changes needed.   Review of Systems: No fevers, chills, night sweats, weight loss, chest pain, or shortness of breath.   Objective:    General: Well Developed, well nourished, and in no acute distress.  Neuro: Alert and oriented x3, extra-ocular muscles intact, sensation grossly intact.  HEENT: Normocephalic, atraumatic, pupils equal round reactive to light, neck supple, no masses, no lymphadenopathy, thyroid nonpalpable.  Skin: Warm and dry, no rashes. Cardiac: Regular rate and rhythm, no murmurs rubs or gallops, no lower extremity edema.  Respiratory: Clear to auscultation bilaterally. Not using accessory muscles, speaking in full sentences.  Impression and Recommendations:    Left low back pain Pain is more evolving to be radicular and discogenic.  Pelvic MRI done by another provider was negative. Suspect spinal stenosis related pain. [facility] seemed to be somewhat of a cursory evaluation and told her she didn't need anything.  We're going to find a different physical therapy outfit, she'll let me know. Until  then any updated x-rays of her lumbar spine as well as do 5 days of prednisone. Return to see me one month after she starts PT to reevaluate.  I spent 25 minutes with this patient, greater than 50% was face-to-face time counseling regarding the above diagnoses ___________________________________________ Gwen Her. Dianah Field, M.D., ABFM., CAQSM. Primary Care and [place] Instructor of [place] of [facility] of Medicine

## 2017-09-11 ENCOUNTER — Telehealth: Payer: Self-pay | Admitting: Family Medicine

## 2017-09-11 ENCOUNTER — Telehealth: Payer: Self-pay

## 2017-09-11 DIAGNOSIS — M545 Low back pain, unspecified: Secondary | ICD-10-CM

## 2017-09-11 DIAGNOSIS — F332 Major depressive disorder, recurrent severe without psychotic features: Secondary | ICD-10-CM | POA: Diagnosis not present

## 2017-09-11 DIAGNOSIS — F411 Generalized anxiety disorder: Secondary | ICD-10-CM | POA: Diagnosis not present

## 2017-09-11 DIAGNOSIS — N3090 Cystitis, unspecified without hematuria: Secondary | ICD-10-CM | POA: Diagnosis not present

## 2017-09-11 DIAGNOSIS — R300 Dysuria: Secondary | ICD-10-CM | POA: Diagnosis not present

## 2017-09-11 DIAGNOSIS — Z87440 Personal history of urinary (tract) infections: Secondary | ICD-10-CM | POA: Diagnosis not present

## 2017-09-11 DIAGNOSIS — G8929 Other chronic pain: Secondary | ICD-10-CM

## 2017-09-11 NOTE — Telephone Encounter (Signed)
[name], Patient's daughter called.  She called and left msg about getting PT with another Provider. She left a message earlier but nobody has returned her call.  Thank you.

## 2017-09-11 NOTE — Telephone Encounter (Signed)
We discussed in the office visit that she would let me know who she wanted me to send for physical therapy, there was an agency other than [facility]. I don't have this information, if she knows who she wants me to use then I'm happy to place the referral.

## 2017-09-11 NOTE — Telephone Encounter (Signed)
Spoke with pt who states she can't find the other therapist so anyone you recommend will be ok. States last PT that came stated she didn't need treatment. States the last therapist was working more on her back and not her hips/pelvis.

## 2017-09-11 NOTE — Telephone Encounter (Signed)
New referral placed.

## 2017-09-11 NOTE — Telephone Encounter (Signed)
I spoke with Mom she doesn't know of anywhere else to go   so she wants to try [facility] again.

## 2017-09-12 DIAGNOSIS — N952 Postmenopausal atrophic vaginitis: Secondary | ICD-10-CM | POA: Diagnosis not present

## 2017-09-12 DIAGNOSIS — N329 Bladder disorder, unspecified: Secondary | ICD-10-CM | POA: Diagnosis not present

## 2017-09-12 DIAGNOSIS — N390 Urinary tract infection, site not specified: Secondary | ICD-10-CM | POA: Diagnosis not present

## 2017-09-12 NOTE — Telephone Encounter (Signed)
Oh my JESUS they JUST told me to pick one in another phone note.  They are no longer allowed to make any more decisions.  I placed the referral to a different [place] agency yesterday.

## 2017-09-15 ENCOUNTER — Ambulatory Visit: Payer: Medicare Other | Admitting: Sports Medicine

## 2017-09-15 DIAGNOSIS — R262 Difficulty in walking, not elsewhere classified: Secondary | ICD-10-CM | POA: Diagnosis not present

## 2017-09-15 DIAGNOSIS — Z9181 History of falling: Secondary | ICD-10-CM | POA: Diagnosis not present

## 2017-09-15 DIAGNOSIS — M545 Low back pain: Secondary | ICD-10-CM | POA: Diagnosis not present

## 2017-09-15 DIAGNOSIS — G8929 Other chronic pain: Secondary | ICD-10-CM | POA: Diagnosis not present

## 2017-09-16 ENCOUNTER — Telehealth: Payer: Self-pay | Admitting: Sports Medicine

## 2017-09-16 DIAGNOSIS — N952 Postmenopausal atrophic vaginitis: Secondary | ICD-10-CM | POA: Diagnosis not present

## 2017-09-16 DIAGNOSIS — N3001 Acute cystitis with hematuria: Secondary | ICD-10-CM | POA: Diagnosis not present

## 2017-09-16 DIAGNOSIS — N760 Acute vaginitis: Secondary | ICD-10-CM | POA: Diagnosis not present

## 2017-09-16 NOTE — Telephone Encounter (Signed)
Representative from [facility] to get a verbal order for Physical Therapy twice a week for 4 weeks....(p) [phone]. Thanks

## 2017-09-16 NOTE — Telephone Encounter (Signed)
Go ahead and call them back and tell them the verbal order has been given.  Also remind them that they don't need an ADDITIONAL verbal order from me if they just read the referral where it states 2-3x a week for 4-6 weeks.

## 2017-09-17 DIAGNOSIS — G8929 Other chronic pain: Secondary | ICD-10-CM | POA: Diagnosis not present

## 2017-09-17 DIAGNOSIS — Z9181 History of falling: Secondary | ICD-10-CM | POA: Diagnosis not present

## 2017-09-17 DIAGNOSIS — R262 Difficulty in walking, not elsewhere classified: Secondary | ICD-10-CM | POA: Diagnosis not present

## 2017-09-17 DIAGNOSIS — M545 Low back pain: Secondary | ICD-10-CM | POA: Diagnosis not present

## 2017-09-18 NOTE — Telephone Encounter (Signed)
Called that number back and voicemail came on but did not have the name of the company or anything did not feel comfortable leaving a detailed message with patient information. Did not leave a vm 09/18/18 @ 1:31pm

## 2017-09-19 DIAGNOSIS — F332 Major depressive disorder, recurrent severe without psychotic features: Secondary | ICD-10-CM | POA: Diagnosis not present

## 2017-09-19 DIAGNOSIS — F411 Generalized anxiety disorder: Secondary | ICD-10-CM | POA: Diagnosis not present

## 2017-09-22 DIAGNOSIS — Z9181 History of falling: Secondary | ICD-10-CM | POA: Diagnosis not present

## 2017-09-22 DIAGNOSIS — R262 Difficulty in walking, not elsewhere classified: Secondary | ICD-10-CM | POA: Diagnosis not present

## 2017-09-22 DIAGNOSIS — M545 Low back pain: Secondary | ICD-10-CM | POA: Diagnosis not present

## 2017-09-22 DIAGNOSIS — G8929 Other chronic pain: Secondary | ICD-10-CM | POA: Diagnosis not present

## 2017-09-24 DIAGNOSIS — Z9181 History of falling: Secondary | ICD-10-CM | POA: Diagnosis not present

## 2017-09-24 DIAGNOSIS — M545 Low back pain: Secondary | ICD-10-CM | POA: Diagnosis not present

## 2017-09-24 DIAGNOSIS — G8929 Other chronic pain: Secondary | ICD-10-CM | POA: Diagnosis not present

## 2017-09-24 DIAGNOSIS — R262 Difficulty in walking, not elsewhere classified: Secondary | ICD-10-CM | POA: Diagnosis not present

## 2017-09-29 DIAGNOSIS — R262 Difficulty in walking, not elsewhere classified: Secondary | ICD-10-CM | POA: Diagnosis not present

## 2017-09-29 DIAGNOSIS — G8929 Other chronic pain: Secondary | ICD-10-CM | POA: Diagnosis not present

## 2017-09-29 DIAGNOSIS — M545 Low back pain: Secondary | ICD-10-CM | POA: Diagnosis not present

## 2017-09-29 DIAGNOSIS — Z9181 History of falling: Secondary | ICD-10-CM | POA: Diagnosis not present

## 2017-10-01 DIAGNOSIS — N390 Urinary tract infection, site not specified: Secondary | ICD-10-CM | POA: Diagnosis not present

## 2017-10-01 DIAGNOSIS — R262 Difficulty in walking, not elsewhere classified: Secondary | ICD-10-CM | POA: Diagnosis not present

## 2017-10-01 DIAGNOSIS — R32 Unspecified urinary incontinence: Secondary | ICD-10-CM | POA: Diagnosis not present

## 2017-10-01 DIAGNOSIS — M545 Low back pain: Secondary | ICD-10-CM | POA: Diagnosis not present

## 2017-10-01 DIAGNOSIS — Z9181 History of falling: Secondary | ICD-10-CM | POA: Diagnosis not present

## 2017-10-01 DIAGNOSIS — G8929 Other chronic pain: Secondary | ICD-10-CM | POA: Diagnosis not present

## 2017-10-06 DIAGNOSIS — Z9181 History of falling: Secondary | ICD-10-CM | POA: Diagnosis not present

## 2017-10-06 DIAGNOSIS — R262 Difficulty in walking, not elsewhere classified: Secondary | ICD-10-CM | POA: Diagnosis not present

## 2017-10-06 DIAGNOSIS — G8929 Other chronic pain: Secondary | ICD-10-CM | POA: Diagnosis not present

## 2017-10-06 DIAGNOSIS — M545 Low back pain: Secondary | ICD-10-CM | POA: Diagnosis not present

## 2017-10-08 ENCOUNTER — Telehealth: Payer: Self-pay | Admitting: Sports Medicine

## 2017-10-08 DIAGNOSIS — M545 Low back pain: Secondary | ICD-10-CM | POA: Diagnosis not present

## 2017-10-08 DIAGNOSIS — G8929 Other chronic pain: Secondary | ICD-10-CM | POA: Diagnosis not present

## 2017-10-08 DIAGNOSIS — Z9181 History of falling: Secondary | ICD-10-CM | POA: Diagnosis not present

## 2017-10-08 DIAGNOSIS — R262 Difficulty in walking, not elsewhere classified: Secondary | ICD-10-CM | POA: Diagnosis not present

## 2017-10-08 NOTE — Telephone Encounter (Signed)
Received VM from Opal Sidles with [facility] for PT order. She requested home health PT 2x a week for 3 weeks.  Returned call, went to VM. VM was generic, so no detail given. Advised to return clinic all at [facility], callback provided.  When we get return call, OK to give verbal.

## 2017-10-13 DIAGNOSIS — M545 Low back pain: Secondary | ICD-10-CM | POA: Diagnosis not present

## 2017-10-13 DIAGNOSIS — Z9181 History of falling: Secondary | ICD-10-CM | POA: Diagnosis not present

## 2017-10-13 DIAGNOSIS — R262 Difficulty in walking, not elsewhere classified: Secondary | ICD-10-CM | POA: Diagnosis not present

## 2017-10-13 DIAGNOSIS — G8929 Other chronic pain: Secondary | ICD-10-CM | POA: Diagnosis not present

## 2017-10-14 ENCOUNTER — Ambulatory Visit (INDEPENDENT_AMBULATORY_CARE_PROVIDER_SITE_OTHER): Payer: Medicare Other | Admitting: Sports Medicine

## 2017-10-14 DIAGNOSIS — M48061 Spinal stenosis, lumbar region without neurogenic claudication: Secondary | ICD-10-CM | POA: Diagnosis not present

## 2017-10-14 DIAGNOSIS — M4807 Spinal stenosis, lumbosacral region: Secondary | ICD-10-CM | POA: Diagnosis not present

## 2017-10-14 MED ORDER — GABAPENTIN 100 MG PO CAPS: ORAL_CAPSULE | ORAL | 11 refills | Status: DC

## 2017-10-14 NOTE — Assessment & Plan Note (Signed)
Fantastic improvements in symptoms, really no back pain or hip pain now with formal physical therapy. She still has some numbness and tingling from the knees down, likely due to spinal stenosis, at this point she failed greater than 6 weeks of conservative measures, adding low-dose gabapentin, and we are going to proceed with a lumbar spine MRI to confirm the diagnosis and likely for interventional planning. I did explain the natural history of lumbar spinal stenosis and the lack of her ability to cure it, expectations were set regarding degree of recovery of the numbness and tingling of her legs.

## 2017-10-14 NOTE — Progress Notes (Signed)
  Subjective:    CC: Follow-up  HPI: Deborah Robbins returns, she has suspect lumbar spinal stenosis, she's done extremely well with physical therapy and has almost no pain, her mobility has improved considerably, she was able to go for a walk. Unfortunately she still is having some numbness in both legs from the knees down, no particular distribution, this really doesn't bother her but she is interested in further workup to determine the cause and possibly medial solution. No bowel or bladder dysfunction, saddle numbness, constitutional symptoms, no recent trauma.  Past medical history:  Negative.  See flowsheet/record as well for more information.  Surgical history: Negative.  See flowsheet/record as well for more information.  Family history: Negative.  See flowsheet/record as well for more information.  Social history: Negative.  See flowsheet/record as well for more information.  Allergies, and medications have been entered into the medical record, reviewed, and no changes needed.   Review of Systems: No fevers, chills, night sweats, weight loss, chest pain, or shortness of breath.   Objective:    General: Well Developed, well nourished, and in no acute distress.  Neuro: Alert and oriented x3, extra-ocular muscles intact, sensation grossly intact.  HEENT: Normocephalic, atraumatic, pupils equal round reactive to light, neck supple, no masses, no lymphadenopathy, thyroid nonpalpable.  Skin: Warm and dry, no rashes. Cardiac: Regular rate and rhythm, no murmurs rubs or gallops, no lower extremity edema.  Respiratory: Clear to auscultation bilaterally. Not using accessory muscles, speaking in full sentences.  Impression and Recommendations:    Lumbar spinal stenosis Fantastic improvements in symptoms, really no back pain or hip pain now with formal physical therapy. She still has some numbness and tingling from the knees down, likely due to spinal stenosis, at this point she failed greater than 6  weeks of conservative measures, adding low-dose gabapentin, and we are going to proceed with a lumbar spine MRI to confirm the diagnosis and likely for interventional planning. I did explain the natural history of lumbar spinal stenosis and the lack of her ability to cure it, expectations were set regarding degree of recovery of the numbness and tingling of her legs.  ___________________________________________ Deborah Robbins Her. Dianah Field, M.D., ABFM., CAQSM. Primary Care and [place] Instructor of [place] of [facility] of Medicine

## 2017-10-15 DIAGNOSIS — M545 Low back pain: Secondary | ICD-10-CM | POA: Diagnosis not present

## 2017-10-15 DIAGNOSIS — G8929 Other chronic pain: Secondary | ICD-10-CM | POA: Diagnosis not present

## 2017-10-15 DIAGNOSIS — R262 Difficulty in walking, not elsewhere classified: Secondary | ICD-10-CM | POA: Diagnosis not present

## 2017-10-15 DIAGNOSIS — Z9181 History of falling: Secondary | ICD-10-CM | POA: Diagnosis not present

## 2017-10-16 DIAGNOSIS — F411 Generalized anxiety disorder: Secondary | ICD-10-CM | POA: Diagnosis not present

## 2017-10-16 DIAGNOSIS — F332 Major depressive disorder, recurrent severe without psychotic features: Secondary | ICD-10-CM | POA: Diagnosis not present

## 2017-10-17 DIAGNOSIS — H2513 Age-related nuclear cataract, bilateral: Secondary | ICD-10-CM | POA: Diagnosis not present

## 2017-10-20 DIAGNOSIS — R262 Difficulty in walking, not elsewhere classified: Secondary | ICD-10-CM | POA: Diagnosis not present

## 2017-10-20 DIAGNOSIS — Z9181 History of falling: Secondary | ICD-10-CM | POA: Diagnosis not present

## 2017-10-20 DIAGNOSIS — G8929 Other chronic pain: Secondary | ICD-10-CM | POA: Diagnosis not present

## 2017-10-20 DIAGNOSIS — M545 Low back pain: Secondary | ICD-10-CM | POA: Diagnosis not present

## 2017-10-22 DIAGNOSIS — R262 Difficulty in walking, not elsewhere classified: Secondary | ICD-10-CM | POA: Diagnosis not present

## 2017-10-22 DIAGNOSIS — Z9181 History of falling: Secondary | ICD-10-CM | POA: Diagnosis not present

## 2017-10-22 DIAGNOSIS — M545 Low back pain: Secondary | ICD-10-CM | POA: Diagnosis not present

## 2017-10-22 DIAGNOSIS — G8929 Other chronic pain: Secondary | ICD-10-CM | POA: Diagnosis not present

## 2017-10-23 DIAGNOSIS — F411 Generalized anxiety disorder: Secondary | ICD-10-CM | POA: Diagnosis not present

## 2017-10-23 DIAGNOSIS — F332 Major depressive disorder, recurrent severe without psychotic features: Secondary | ICD-10-CM | POA: Diagnosis not present

## 2017-10-27 ENCOUNTER — Ambulatory Visit (INDEPENDENT_AMBULATORY_CARE_PROVIDER_SITE_OTHER): Payer: Medicare Other

## 2017-10-27 DIAGNOSIS — K625 Hemorrhage of anus and rectum: Secondary | ICD-10-CM | POA: Diagnosis not present

## 2017-10-27 DIAGNOSIS — R262 Difficulty in walking, not elsewhere classified: Secondary | ICD-10-CM | POA: Diagnosis not present

## 2017-10-27 DIAGNOSIS — M48062 Spinal stenosis, lumbar region with neurogenic claudication: Secondary | ICD-10-CM

## 2017-10-27 DIAGNOSIS — M545 Low back pain: Secondary | ICD-10-CM | POA: Diagnosis not present

## 2017-10-27 DIAGNOSIS — D500 Iron deficiency anemia secondary to blood loss (chronic): Secondary | ICD-10-CM | POA: Diagnosis not present

## 2017-10-27 DIAGNOSIS — I10 Essential (primary) hypertension: Secondary | ICD-10-CM | POA: Diagnosis not present

## 2017-10-27 DIAGNOSIS — M4807 Spinal stenosis, lumbosacral region: Secondary | ICD-10-CM

## 2017-10-27 DIAGNOSIS — N952 Postmenopausal atrophic vaginitis: Secondary | ICD-10-CM | POA: Diagnosis not present

## 2017-10-27 DIAGNOSIS — F418 Other specified anxiety disorders: Secondary | ICD-10-CM | POA: Diagnosis not present

## 2017-10-27 DIAGNOSIS — Z79899 Other long term (current) drug therapy: Secondary | ICD-10-CM | POA: Diagnosis not present

## 2017-10-27 DIAGNOSIS — G8929 Other chronic pain: Secondary | ICD-10-CM | POA: Diagnosis not present

## 2017-10-27 DIAGNOSIS — M48061 Spinal stenosis, lumbar region without neurogenic claudication: Secondary | ICD-10-CM | POA: Diagnosis not present

## 2017-10-27 DIAGNOSIS — N390 Urinary tract infection, site not specified: Secondary | ICD-10-CM | POA: Diagnosis not present

## 2017-10-27 DIAGNOSIS — Z9181 History of falling: Secondary | ICD-10-CM | POA: Diagnosis not present

## 2017-10-29 DIAGNOSIS — G8929 Other chronic pain: Secondary | ICD-10-CM | POA: Diagnosis not present

## 2017-10-29 DIAGNOSIS — M545 Low back pain: Secondary | ICD-10-CM | POA: Diagnosis not present

## 2017-10-29 DIAGNOSIS — R262 Difficulty in walking, not elsewhere classified: Secondary | ICD-10-CM | POA: Diagnosis not present

## 2017-10-29 DIAGNOSIS — Z9181 History of falling: Secondary | ICD-10-CM | POA: Diagnosis not present

## 2017-10-30 DIAGNOSIS — F411 Generalized anxiety disorder: Secondary | ICD-10-CM | POA: Diagnosis not present

## 2017-10-30 DIAGNOSIS — F332 Major depressive disorder, recurrent severe without psychotic features: Secondary | ICD-10-CM | POA: Diagnosis not present

## 2017-10-31 ENCOUNTER — Ambulatory Visit (INDEPENDENT_AMBULATORY_CARE_PROVIDER_SITE_OTHER): Payer: Medicare Other | Admitting: Sports Medicine

## 2017-10-31 DIAGNOSIS — M48061 Spinal stenosis, lumbar region without neurogenic claudication: Secondary | ICD-10-CM

## 2017-10-31 NOTE — Progress Notes (Signed)
  Subjective:    CC: MRI  HPI: This is a pleasant 83 year old female, she has had some back pain, and bilateral extremity paresthesias, slight weakness for a long time now, no diagnosis.  We suspected lumbar spinal stenosis, she really did not respond all that well to neuropathic agents, meloxicam and physical therapy so we obtained an MRI the results of which will be dictated below.  No bowel or bladder dysfunction, saddle numbness, constitutional symptoms.  Past medical history:  Negative.  See flowsheet/record as well for more information.  Surgical history: Negative.  See flowsheet/record as well for more information.  Family history: Negative.  See flowsheet/record as well for more information.  Social history: Negative.  See flowsheet/record as well for more information.  Allergies, and medications have been entered into the medical record, reviewed, and no changes needed.   Review of Systems: No fevers, chills, night sweats, weight loss, chest pain, or shortness of breath.   Objective:    General: Well Developed, well nourished, and in no acute distress.  Neuro: Alert and oriented x3, extra-ocular muscles intact, sensation grossly intact.  HEENT: Normocephalic, atraumatic, pupils equal round reactive to light, neck supple, no masses, no lymphadenopathy, thyroid nonpalpable.  Skin: Warm and dry, no rashes. Cardiac: Regular rate and rhythm, no murmurs rubs or gallops, no lower extremity edema.  Respiratory: Clear to auscultation bilaterally. Not using accessory muscles, speaking in full sentences.  MRI personally reviewed, we went over the images with the patient, she has multilevel Lumbar spinal stenosis, multifactorial from disc protrusions, facet hypertrophy and ligamentum flavum hypertrophy with degenerative scoliosis.  Spinal stenosis is the most tight on the left at the L4-L5 level, spinal canal is narrowed down to approximately 5 mm at this level.  Impression and  Recommendations:    Lumbar spinal stenosis Multilevel lumbar spinal stenosis as expected. Principal symptom is not axial pain but more paresthesias in the legs. We are going to proceed with a left L4-L5 interlaminar epidural, spinal stenosis is tightest at this level. We did set expectations as to what to expect, return to see me 1 month after the injection to evaluate response.  I spent 25 minutes with this patient, greater than 50% was face-to-face time counseling regarding the above diagnoses ___________________________________________ Gwen Her. Dianah Field, M.D., ABFM., CAQSM. Primary Care and [place] Instructor of [place] of [facility] of Medicine

## 2017-10-31 NOTE — Assessment & Plan Note (Signed)
Multilevel lumbar spinal stenosis as expected. Principal symptom is not axial pain but more paresthesias in the legs. We are going to proceed with a left L4-L5 interlaminar epidural, spinal stenosis is tightest at this level. We did set expectations as to what to expect, return to see me 1 month after the injection to evaluate response.

## 2017-11-03 DIAGNOSIS — R300 Dysuria: Secondary | ICD-10-CM | POA: Diagnosis not present

## 2017-11-06 DIAGNOSIS — F411 Generalized anxiety disorder: Secondary | ICD-10-CM | POA: Diagnosis not present

## 2017-11-06 DIAGNOSIS — M810 Age-related osteoporosis without current pathological fracture: Secondary | ICD-10-CM | POA: Diagnosis not present

## 2017-11-06 DIAGNOSIS — F332 Major depressive disorder, recurrent severe without psychotic features: Secondary | ICD-10-CM | POA: Diagnosis not present

## 2017-11-12 ENCOUNTER — Ambulatory Visit
Admission: RE | Admit: 2017-11-12 | Discharge: 2017-11-12 | Disposition: A | Discharge status: Home / Self Care | Payer: Medicare Other | Source: Ambulatory Visit | Attending: Sports Medicine | Admitting: Sports Medicine

## 2017-11-12 DIAGNOSIS — M48061 Spinal stenosis, lumbar region without neurogenic claudication: Secondary | ICD-10-CM | POA: Diagnosis not present

## 2017-11-12 MED ORDER — IOPAMIDOL (ISOVUE-M 200) INJECTION 41%
1.0000 mL | Freq: Once | INTRAMUSCULAR | Status: AC
  Administered 2017-11-12: 1 mL via EPIDURAL

## 2017-11-12 MED ORDER — METHYLPREDNISOLONE ACETATE 40 MG/ML INJ SUSP (RADIOLOG
120.0000 mg | Freq: Once | INTRAMUSCULAR | Status: AC
  Administered 2017-11-12: 120 mg via EPIDURAL

## 2017-11-12 NOTE — Discharge Instructions (Signed)

## 2017-11-26 DIAGNOSIS — N390 Urinary tract infection, site not specified: Secondary | ICD-10-CM | POA: Diagnosis not present

## 2017-11-26 DIAGNOSIS — N952 Postmenopausal atrophic vaginitis: Secondary | ICD-10-CM | POA: Diagnosis not present

## 2017-11-27 DIAGNOSIS — F411 Generalized anxiety disorder: Secondary | ICD-10-CM | POA: Diagnosis not present

## 2017-11-27 DIAGNOSIS — F332 Major depressive disorder, recurrent severe without psychotic features: Secondary | ICD-10-CM | POA: Diagnosis not present

## 2017-12-04 DIAGNOSIS — F332 Major depressive disorder, recurrent severe without psychotic features: Secondary | ICD-10-CM | POA: Diagnosis not present

## 2017-12-04 DIAGNOSIS — F411 Generalized anxiety disorder: Secondary | ICD-10-CM | POA: Diagnosis not present

## 2017-12-18 DIAGNOSIS — F332 Major depressive disorder, recurrent severe without psychotic features: Secondary | ICD-10-CM | POA: Diagnosis not present

## 2017-12-18 DIAGNOSIS — F411 Generalized anxiety disorder: Secondary | ICD-10-CM | POA: Diagnosis not present

## 2017-12-24 DIAGNOSIS — Z87891 Personal history of nicotine dependence: Secondary | ICD-10-CM | POA: Diagnosis not present

## 2017-12-24 DIAGNOSIS — I10 Essential (primary) hypertension: Secondary | ICD-10-CM | POA: Diagnosis not present

## 2017-12-24 DIAGNOSIS — N390 Urinary tract infection, site not specified: Secondary | ICD-10-CM | POA: Diagnosis not present

## 2017-12-24 DIAGNOSIS — S22000A Wedge compression fracture of unspecified thoracic vertebra, initial encounter for closed fracture: Secondary | ICD-10-CM | POA: Diagnosis not present

## 2017-12-24 DIAGNOSIS — Z87440 Personal history of urinary (tract) infections: Secondary | ICD-10-CM | POA: Diagnosis not present

## 2017-12-25 DIAGNOSIS — R300 Dysuria: Secondary | ICD-10-CM | POA: Diagnosis not present

## 2018-01-01 DIAGNOSIS — F332 Major depressive disorder, recurrent severe without psychotic features: Secondary | ICD-10-CM | POA: Diagnosis not present

## 2018-01-08 DIAGNOSIS — F332 Major depressive disorder, recurrent severe without psychotic features: Secondary | ICD-10-CM | POA: Diagnosis not present

## 2018-01-08 DIAGNOSIS — F411 Generalized anxiety disorder: Secondary | ICD-10-CM | POA: Diagnosis not present

## 2018-01-15 DIAGNOSIS — F411 Generalized anxiety disorder: Secondary | ICD-10-CM | POA: Diagnosis not present

## 2018-01-15 DIAGNOSIS — F332 Major depressive disorder, recurrent severe without psychotic features: Secondary | ICD-10-CM | POA: Diagnosis not present

## 2018-01-16 ENCOUNTER — Ambulatory Visit (INDEPENDENT_AMBULATORY_CARE_PROVIDER_SITE_OTHER): Payer: Medicare Other | Admitting: Sports Medicine

## 2018-01-16 ENCOUNTER — Encounter: Payer: Self-pay | Admitting: Sports Medicine

## 2018-01-16 DIAGNOSIS — M48061 Spinal stenosis, lumbar region without neurogenic claudication: Secondary | ICD-10-CM

## 2018-01-16 NOTE — Progress Notes (Signed)
  Subjective:    CC: Back pain  HPI: This is a very pleasant 84 year old female, she has severe lumbar spinal stenosis, we did a single epidural and she had a fantastic response.  She is now having a slight recurrence of pain and would like to proceed with another epidural.  No bowel or bladder dysfunction, saddle numbness, no constitutional symptoms.  I reviewed the past medical history, family history, social history, surgical history, and allergies today and no changes were needed.  Please see the problem list section below in epic for further details.  Past Medical History: No past medical history on file. Past Surgical History: History reviewed. No pertinent surgical history. Social History: Social History   Socioeconomic History  . Marital status: Unknown    Spouse name: None  . Number of children: None  . Years of education: None  . Highest education level: None  Social Needs  . Financial resource strain: None  . Food insecurity - worry: None  . Food insecurity - inability: None  . Transportation needs - medical: None  . Transportation needs - non-medical: None  Occupational History  . None  Tobacco Use  . Smoking status: Former Research scientist (life sciences)  . Smokeless tobacco: Never Used  Substance and Sexual Activity  . Alcohol use: No  . Drug use: No  . Sexual activity: No  Other Topics Concern  . None  Social History Narrative  . None   Family History: No family history on file. Allergies: No Known Allergies Medications: See med rec.  Review of Systems: No fevers, chills, night sweats, weight loss, chest pain, or shortness of breath.   Objective:    General: Well Developed, well nourished, and in no acute distress.  Neuro: Alert and oriented x3, extra-ocular muscles intact, sensation grossly intact.  HEENT: Normocephalic, atraumatic, pupils equal round reactive to light, neck supple, no masses, no lymphadenopathy, thyroid nonpalpable.  Skin: Warm and dry, no  rashes. Cardiac: Regular rate and rhythm, no murmurs rubs or gallops, no lower extremity edema.  Respiratory: Clear to auscultation bilaterally. Not using accessory muscles, speaking in full sentences.  Impression and Recommendations:    Lumbar spinal stenosis This patient did very well after her left L4-L5 interlaminar epidural, I do believe the radiologist placed the injection the level below considering how tight the stenosis was. She did extremely well but is now having a recurrence of symptoms. I am going to repeat the epidural, she can call back if she would like to do epidural #3 in the series.  I spent 25 minutes with this patient, greater than 50% was face-to-face time counseling regarding the above diagnoses ___________________________________________ Gwen Her. Dianah Field, M.D., ABFM., CAQSM. Primary Care and [place] Instructor of [place] of [facility] of Medicine

## 2018-01-16 NOTE — Assessment & Plan Note (Signed)
This patient did very well after her left L4-L5 interlaminar epidural, I do believe the radiologist placed the injection the level below considering how tight the stenosis was. She did extremely well but is now having a recurrence of symptoms. I am going to repeat the epidural, she can call back if she would like to do epidural #3 in the series.

## 2018-01-22 DIAGNOSIS — F411 Generalized anxiety disorder: Secondary | ICD-10-CM | POA: Diagnosis not present

## 2018-01-22 DIAGNOSIS — F332 Major depressive disorder, recurrent severe without psychotic features: Secondary | ICD-10-CM | POA: Diagnosis not present

## 2018-01-27 ENCOUNTER — Ambulatory Visit
Admission: RE | Admit: 2018-01-27 | Discharge: 2018-01-27 | Disposition: A | Discharge status: Home / Self Care | Payer: Medicare Other | Source: Ambulatory Visit | Attending: Sports Medicine | Admitting: Sports Medicine

## 2018-01-27 DIAGNOSIS — M47817 Spondylosis without myelopathy or radiculopathy, lumbosacral region: Secondary | ICD-10-CM | POA: Diagnosis not present

## 2018-01-27 MED ORDER — METHYLPREDNISOLONE ACETATE 40 MG/ML INJ SUSP (RADIOLOG
120.0000 mg | Freq: Once | INTRAMUSCULAR | Status: AC
  Administered 2018-01-27: 120 mg via EPIDURAL

## 2018-01-27 MED ORDER — IOPAMIDOL (ISOVUE-M 200) INJECTION 41%
1.0000 mL | Freq: Once | INTRAMUSCULAR | Status: AC
  Administered 2018-01-27: 1 mL via EPIDURAL

## 2018-01-27 NOTE — Discharge Instructions (Signed)

## 2018-02-05 DIAGNOSIS — F332 Major depressive disorder, recurrent severe without psychotic features: Secondary | ICD-10-CM | POA: Diagnosis not present

## 2018-02-05 DIAGNOSIS — F411 Generalized anxiety disorder: Secondary | ICD-10-CM | POA: Diagnosis not present

## 2018-02-06 DIAGNOSIS — L97511 Non-pressure chronic ulcer of other part of right foot limited to breakdown of skin: Secondary | ICD-10-CM | POA: Diagnosis not present

## 2018-02-12 DIAGNOSIS — F411 Generalized anxiety disorder: Secondary | ICD-10-CM | POA: Diagnosis not present

## 2018-02-12 DIAGNOSIS — F332 Major depressive disorder, recurrent severe without psychotic features: Secondary | ICD-10-CM | POA: Diagnosis not present

## 2018-02-16 ENCOUNTER — Ambulatory Visit (INDEPENDENT_AMBULATORY_CARE_PROVIDER_SITE_OTHER): Payer: Medicare Other

## 2018-02-16 ENCOUNTER — Encounter: Payer: Self-pay | Admitting: Sports Medicine

## 2018-02-16 ENCOUNTER — Ambulatory Visit (INDEPENDENT_AMBULATORY_CARE_PROVIDER_SITE_OTHER): Payer: Medicare Other | Admitting: Sports Medicine

## 2018-02-16 DIAGNOSIS — M25552 Pain in left hip: Secondary | ICD-10-CM

## 2018-02-16 DIAGNOSIS — K5730 Diverticulosis of large intestine without perforation or abscess without bleeding: Secondary | ICD-10-CM | POA: Diagnosis not present

## 2018-02-16 DIAGNOSIS — M84350A Stress fracture, pelvis, initial encounter for fracture: Secondary | ICD-10-CM

## 2018-02-16 DIAGNOSIS — M4807 Spinal stenosis, lumbosacral region: Secondary | ICD-10-CM | POA: Diagnosis not present

## 2018-02-16 DIAGNOSIS — M48061 Spinal stenosis, lumbar region without neurogenic claudication: Secondary | ICD-10-CM | POA: Diagnosis not present

## 2018-02-16 DIAGNOSIS — M7918 Myalgia, other site: Secondary | ICD-10-CM | POA: Insufficient documentation

## 2018-02-16 NOTE — Progress Notes (Signed)
  Subjective:    CC: Follow-up  HPI: Lumbar spinal stenosis: The first epidural provided fantastic relief, only minimal to moderate relief from the second 1.  Left buttock pain: Localized to the posterior ischium but not directly over the ischial tuberosity, moderate, persistent.  Worse with weightbearing and walking.  This feels different from her typical spinal stenosis pain.  I reviewed the past medical history, family history, social history, surgical history, and allergies today and no changes were needed.  Please see the problem list section below in epic for further details.  Past Medical History: No past medical history on file. Past Surgical History: No past surgical history on file. Social History: Social History   Socioeconomic History  . Marital status: Unknown    Spouse name: None  . Number of children: None  . Years of education: None  . Highest education level: None  Social Needs  . Financial resource strain: None  . Food insecurity - worry: None  . Food insecurity - inability: None  . Transportation needs - medical: None  . Transportation needs - non-medical: None  Occupational History  . None  Tobacco Use  . Smoking status: Former Research scientist (life sciences)  . Smokeless tobacco: Never Used  Substance and Sexual Activity  . Alcohol use: No  . Drug use: No  . Sexual activity: No  Other Topics Concern  . None  Social History Narrative  . None   Family History: No family history on file. Allergies: No Known Allergies Medications: See med rec.  Review of Systems: No fevers, chills, night sweats, weight loss, chest pain, or shortness of breath.   Objective:    General: Well Developed, well nourished, and in no acute distress.  Neuro: Alert and oriented x3, extra-ocular muscles intact, sensation grossly intact.  HEENT: Normocephalic, atraumatic, pupils equal round reactive to light, neck supple, no masses, no lymphadenopathy, thyroid nonpalpable.  Skin: Warm and dry, no  rashes. Cardiac: Regular rate and rhythm, no murmurs rubs or gallops, no lower extremity edema.  Respiratory: Clear to auscultation bilaterally. Not using accessory muscles, speaking in full sentences. Left hip: ROM IR: 60 Deg, ER: 60 Deg, Flexion: 120 Deg, Extension: 100 Deg, Abduction: 45 Deg, Adduction: 45 Deg Strength IR: 5/5, ER: 5/5, Flexion: 5/5, Extension: 5/5, Abduction: 5/5, Adduction: 5/5 Pelvic alignment unremarkable to inspection and palpation. Standing hip rotation and gait without trendelenburg / unsteadiness. Greater trochanter without tenderness to palpation. No tenderness over piriformis. No SI joint tenderness and normal minimal SI movement. Tender to palpation over the ischium posteriorly, but more proximal than the ischial tuberosity.  Impression and Recommendations:    Suspect stress fracture of pelvis Present now for many months in spite of conservative measures. Adding a hip and pelvis x-ray left, as well as a left hip/pelvic MRI looking for a ischial stress fracture.  Lumbar spinal stenosis Has done well after left L5-S1 epidurals for L4-L5 spinal stenosis. Ordering epidural #3 ___________________________________________ Gwen Her. Dianah Field, M.D., ABFM., CAQSM. Primary Care and [place] Instructor of [place] of [facility] of Medicine

## 2018-02-16 NOTE — Assessment & Plan Note (Signed)
Present now for many months in spite of conservative measures. Adding a hip and pelvis x-ray left, as well as a left hip/pelvic MRI looking for a ischial stress fracture.

## 2018-02-16 NOTE — Assessment & Plan Note (Signed)
Has done well after left L5-S1 epidurals for L4-L5 spinal stenosis. Ordering epidural #3

## 2018-02-19 DIAGNOSIS — F332 Major depressive disorder, recurrent severe without psychotic features: Secondary | ICD-10-CM | POA: Diagnosis not present

## 2018-02-19 DIAGNOSIS — F411 Generalized anxiety disorder: Secondary | ICD-10-CM | POA: Diagnosis not present

## 2018-02-20 ENCOUNTER — Ambulatory Visit (INDEPENDENT_AMBULATORY_CARE_PROVIDER_SITE_OTHER): Payer: Medicare Other | Admitting: Sports Medicine

## 2018-02-20 DIAGNOSIS — M7918 Myalgia, other site: Secondary | ICD-10-CM | POA: Diagnosis not present

## 2018-02-20 NOTE — Assessment & Plan Note (Signed)
Present for months, x-rays were overall unremarkable, hip/pelvic MRI did not show any evidence of a pelvic bone stress injury, she does have some degenerative changes in the hip joint itself. As these can be responsible for her posterior buttock pain we are going to try a diagnostic and therapeutic left hip joint injection. Return to see me 1 month to evaluate relief. If she does not have relief we will need to direct more of our treatment towards her lumbar spine pathology.

## 2018-02-20 NOTE — Progress Notes (Signed)
Subjective:    CC: Follow-up after MRI  HPI: This is a pleasant 84 year old female, she had left posterior buttock pain, after failure of conservative measures we obtained an MRI of the pelvis expecting to find an ischial stress fracture, her ischial bones as well as the rest of her pelvis were unremarkable, she had expected lower lumbar degenerative disc disease and facet arthritis, as well as some synovial herniation pits in the hip joint itself.  Pain is moderate, persistent.  I reviewed the past medical history, family history, social history, surgical history, and allergies today and no changes were needed.  Please see the problem list section below in epic for further details.  Past Medical History: No past medical history on file. Past Surgical History: No past surgical history on file. Social History: Social History   Socioeconomic History  . Marital status: Unknown    Spouse name: Not on file  . Number of children: Not on file  . Years of education: Not on file  . Highest education level: Not on file  Social Needs  . Financial resource strain: Not on file  . Food insecurity - worry: Not on file  . Food insecurity - inability: Not on file  . Transportation needs - medical: Not on file  . Transportation needs - non-medical: Not on file  Occupational History  . Not on file  Tobacco Use  . Smoking status: Former Research scientist (life sciences)  . Smokeless tobacco: Never Used  Substance and Sexual Activity  . Alcohol use: No  . Drug use: No  . Sexual activity: No  Other Topics Concern  . Not on file  Social History Narrative  . Not on file   Family History: No family history on file. Allergies: No Known Allergies Medications: See med rec.  Review of Systems: No fevers, chills, night sweats, weight loss, chest pain, or shortness of breath.   Objective:    General: Well Developed, well nourished, and in no acute distress.  Neuro: Alert and oriented x3, extra-ocular muscles intact,  sensation grossly intact.  HEENT: Normocephalic, atraumatic, pupils equal round reactive to light, neck supple, no masses, no lymphadenopathy, thyroid nonpalpable.  Skin: Warm and dry, no rashes. Cardiac: Regular rate and rhythm, no murmurs rubs or gallops, no lower extremity edema.  Respiratory: Clear to auscultation bilaterally. Not using accessory muscles, speaking in full sentences.  Procedure: Real-time Ultrasound Guided Injection of left hip joint Device: GE Logiq E  Verbal informed consent obtained.  Time-out conducted.  Noted no overlying erythema, induration, or other signs of local infection.  Skin prepped in a sterile fashion.  Local anesthesia: Topical Ethyl chloride.  With sterile technique and under real time ultrasound guidance: Oozing a 22-gauge spinal needle advanced to the femoral head/neck junction, contacted bone and injected 1 cc kenalog 40, 2 cc lidocaine, 2 cc bupivacaine. Completed without difficulty  Pain immediately resolved suggesting accurate placement of the medication.  Advised to call if fevers/chills, erythema, induration, drainage, or persistent bleeding.  Images permanently stored and available for review in the ultrasound unit.  Impression: Technically successful ultrasound guided injection.  Impression and Recommendations:    Pain in left buttock Present for months, x-rays were overall unremarkable, hip/pelvic MRI did not show any evidence of a pelvic bone stress injury, she does have some degenerative changes in the hip joint itself. As these can be responsible for her posterior buttock pain we are going to try a diagnostic and therapeutic left hip joint injection. Return to see me 1  month to evaluate relief. If she does not have relief we will need to direct more of our treatment towards her lumbar spine pathology.  ___________________________________________ Gwen Her. Dianah Field, M.D., ABFM., CAQSM. Primary Care and [place] Instructor of [place] of [facility] of [facility]

## 2018-02-27 DIAGNOSIS — D500 Iron deficiency anemia secondary to blood loss (chronic): Secondary | ICD-10-CM | POA: Diagnosis not present

## 2018-02-27 DIAGNOSIS — I10 Essential (primary) hypertension: Secondary | ICD-10-CM | POA: Diagnosis not present

## 2018-02-27 DIAGNOSIS — Z87440 Personal history of urinary (tract) infections: Secondary | ICD-10-CM | POA: Diagnosis not present

## 2018-02-27 DIAGNOSIS — K625 Hemorrhage of anus and rectum: Secondary | ICD-10-CM | POA: Diagnosis not present

## 2018-02-27 DIAGNOSIS — F418 Other specified anxiety disorders: Secondary | ICD-10-CM | POA: Diagnosis not present

## 2018-03-12 DIAGNOSIS — F411 Generalized anxiety disorder: Secondary | ICD-10-CM | POA: Diagnosis not present

## 2018-03-12 DIAGNOSIS — F332 Major depressive disorder, recurrent severe without psychotic features: Secondary | ICD-10-CM | POA: Diagnosis not present

## 2018-03-17 ENCOUNTER — Telehealth: Payer: Self-pay | Admitting: Sports Medicine

## 2018-03-17 MED ORDER — CELECOXIB 200 MG PO CAPS: ORAL_CAPSULE | ORAL | 2 refills | Status: DC

## 2018-03-17 NOTE — Telephone Encounter (Signed)
No problem, calling in Celebrex.

## 2018-03-17 NOTE — Telephone Encounter (Signed)
Pt's daughter called and would like to know if her mother can get a prescription for celebrex to help with her hip pain. She stated she had given her one of hers and it had eased her pain a lot. I advised that she will probably need to wait until she is seen Friday but I would send you a message. Thanks

## 2018-03-18 NOTE — Telephone Encounter (Signed)
Called pt and advised medication was sent to pharmacy.

## 2018-03-19 ENCOUNTER — Ambulatory Visit
Admission: RE | Admit: 2018-03-19 | Discharge: 2018-03-19 | Disposition: A | Discharge status: Home / Self Care | Payer: Medicare Other | Source: Ambulatory Visit | Attending: Sports Medicine | Admitting: Sports Medicine

## 2018-03-19 DIAGNOSIS — M48061 Spinal stenosis, lumbar region without neurogenic claudication: Secondary | ICD-10-CM | POA: Diagnosis not present

## 2018-03-19 MED ORDER — IOPAMIDOL (ISOVUE-M 200) INJECTION 41%
1.0000 mL | Freq: Once | INTRAMUSCULAR | Status: AC
  Administered 2018-03-19: 1 mL via EPIDURAL

## 2018-03-19 MED ORDER — METHYLPREDNISOLONE ACETATE 40 MG/ML INJ SUSP (RADIOLOG
120.0000 mg | Freq: Once | INTRAMUSCULAR | Status: AC
  Administered 2018-03-19: 120 mg via EPIDURAL

## 2018-03-19 NOTE — Discharge Instructions (Signed)

## 2018-03-20 ENCOUNTER — Encounter: Payer: Self-pay | Admitting: Sports Medicine

## 2018-03-20 ENCOUNTER — Ambulatory Visit (INDEPENDENT_AMBULATORY_CARE_PROVIDER_SITE_OTHER): Payer: Medicare Other | Admitting: Sports Medicine

## 2018-03-20 DIAGNOSIS — M48061 Spinal stenosis, lumbar region without neurogenic claudication: Secondary | ICD-10-CM | POA: Diagnosis not present

## 2018-03-20 DIAGNOSIS — N390 Urinary tract infection, site not specified: Secondary | ICD-10-CM | POA: Diagnosis not present

## 2018-03-20 MED ORDER — TRAMADOL HCL 50 MG PO TABS: 50.0000 mg | ORAL_TABLET | Freq: Three times a day (TID) | ORAL | 0 refills | Status: DC | PRN

## 2018-03-20 NOTE — Assessment & Plan Note (Signed)
Multilevel lumbar spinal stenosis, multilevel severe facet arthritis. Epidurals have provided moderate relief, MRI of the pelvis was unremarkable, she did have some hip osteoarthritis, we injected her hip joint for diagnostic and therapeutic purposes, this did not provide any relief. She did just have her third epidural yesterday, I am going to also set her up with neurosurgery to discuss multilevel decompression, as well as set her up for multilevel facet joint injections on the left, L3-S1. Ultimately I think we may end up with medical pain management, adding some tramadol.

## 2018-03-20 NOTE — Progress Notes (Signed)
Subjective:    CC: Follow-up  HPI: This is a healthy 84 year old female, we have been treating her back and buttock pain for some time now, she had several lumbar epidurals for multilevel lumbar spinal stenosis, multifactorial from disc protrusions and facet hypertrophy, she had good responses to the initial epidurals but these have been waning.  She just had her third epidural yesterday.  We have never done facet joint injections.  Her pelvic MRI did show some hip osteoarthritis but nothing else abnormal in the pelvis.  We did a hip joint injection for diagnostic and therapeutic purposes without much efficacy.  Continue to have pain, moderate, persistent, localized in the back, as well as the upper buttock.  I reviewed the past medical history, family history, social history, surgical history, and allergies today and no changes were needed.  Please see the problem list section below in epic for further details.  Past Medical History: No past medical history on file. Past Surgical History: No past surgical history on file. Social History: Social History   Socioeconomic History  . Marital status: Unknown    Spouse name: Not on file  . Number of children: Not on file  . Years of education: Not on file  . Highest education level: Not on file  Occupational History  . Not on file  Social Needs  . Financial resource strain: Not on file  . Food insecurity:    Worry: Not on file    Inability: Not on file  . Transportation needs:    Medical: Not on file    Non-medical: Not on file  Tobacco Use  . Smoking status: Former Research scientist (life sciences)  . Smokeless tobacco: Never Used  Substance and Sexual Activity  . Alcohol use: No  . Drug use: No  . Sexual activity: Never  Lifestyle  . Physical activity:    Days per week: Not on file    Minutes per session: Not on file  . Stress: Not on file  Relationships  . Social connections:    Talks on phone: Not on file    Gets together: Not on file    Attends  religious service: Not on file    Active member of club or organization: Not on file    Attends meetings of clubs or organizations: Not on file    Relationship status: Not on file  Other Topics Concern  . Not on file  Social History Narrative  . Not on file   Family History: No family history on file. Allergies: No Known Allergies Medications: See med rec.  Review of Systems: No fevers, chills, night sweats, weight loss, chest pain, or shortness of breath.   Objective:    General: Well Developed, well nourished, and in no acute distress.  Neuro: Alert and oriented x3, extra-ocular muscles intact, sensation grossly intact.  HEENT: Normocephalic, atraumatic, pupils equal round reactive to light, neck supple, no masses, no lymphadenopathy, thyroid nonpalpable.  Skin: Warm and dry, no rashes. Cardiac: Regular rate and rhythm, no murmurs rubs or gallops, no lower extremity edema.  Respiratory: Clear to auscultation bilaterally. Not using accessory muscles, speaking in full sentences.  Impression and Recommendations:    Lumbar spinal stenosis Multilevel lumbar spinal stenosis, multilevel severe facet arthritis. Epidurals have provided moderate relief, MRI of the pelvis was unremarkable, she did have some hip osteoarthritis, we injected her hip joint for diagnostic and therapeutic purposes, this did not provide any relief. She did just have her third epidural yesterday, I am going to  also set her up with neurosurgery to discuss multilevel decompression, as well as set her up for multilevel facet joint injections on the left, L3-S1. Ultimately I think we may end up with medical pain management, adding some tramadol.  I spent 25 minutes with this patient, greater than 50% was face-to-face time counseling regarding the above diagnoses ___________________________________________ Gwen Her. Dianah Field, M.D., ABFM., CAQSM. Primary Care and [place] Instructor of [place] of [facility] of Medicine

## 2018-03-24 DIAGNOSIS — I10 Essential (primary) hypertension: Secondary | ICD-10-CM | POA: Diagnosis not present

## 2018-03-24 DIAGNOSIS — M25552 Pain in left hip: Secondary | ICD-10-CM | POA: Diagnosis not present

## 2018-03-26 DIAGNOSIS — F332 Major depressive disorder, recurrent severe without psychotic features: Secondary | ICD-10-CM | POA: Diagnosis not present

## 2018-03-26 DIAGNOSIS — F411 Generalized anxiety disorder: Secondary | ICD-10-CM | POA: Diagnosis not present

## 2018-04-02 DIAGNOSIS — M549 Dorsalgia, unspecified: Secondary | ICD-10-CM | POA: Diagnosis not present

## 2018-04-02 DIAGNOSIS — R309 Painful micturition, unspecified: Secondary | ICD-10-CM | POA: Diagnosis not present

## 2018-04-02 DIAGNOSIS — Z87440 Personal history of urinary (tract) infections: Secondary | ICD-10-CM | POA: Diagnosis not present

## 2018-04-07 DIAGNOSIS — M7138 Other bursal cyst, other site: Secondary | ICD-10-CM | POA: Diagnosis not present

## 2018-04-07 DIAGNOSIS — M48062 Spinal stenosis, lumbar region with neurogenic claudication: Secondary | ICD-10-CM | POA: Diagnosis not present

## 2018-04-07 DIAGNOSIS — I10 Essential (primary) hypertension: Secondary | ICD-10-CM | POA: Diagnosis not present

## 2018-04-08 ENCOUNTER — Other Ambulatory Visit: Payer: Self-pay | Admitting: Neurosurgery

## 2018-04-08 ENCOUNTER — Other Ambulatory Visit: Payer: Self-pay

## 2018-04-08 ENCOUNTER — Encounter (HOSPITAL_COMMUNITY): Payer: Self-pay | Admitting: *Deleted

## 2018-04-08 NOTE — Progress Notes (Signed)
Spoke with pt's daughter, [name] for pre-op call. Pt was in same room with her daughter. Pt has hx of mitral regurgitation and is on Diltiazem. Does not have a cardiologist, is followed by Dr. Earley Brooke at [facility] in [place]. Pt denies chest pain or sob.

## 2018-04-08 NOTE — Progress Notes (Signed)
Anesthesia chart review: SAME DAY WORK-UP.  Patient is an 84 year old female scheduled for laminectomy for facet/synovial cyst L4-5, L5-S1, left on 04/09/2018 by Dr. Ashok Pall. Case is scheduled for 2:15 PM. Patient was just evaluated by Dr. Christella Noa on 04/07/18.  History includes former smoker, postoperative N/V, mitral regurgitation (mod-severe 03/2017), hypertension, anxiety, depression, GERD, arthritis, iron deficiency anemia, diverticulitis, frequent UTIs, cholecystectomy, hysterectomy, appendectomy, kyphoplasty 02/2017, incisional hernia repair '15. In review of [facility] records in [place], she presented to the ED on 04/14/17 with increasing SOB and was found to be hypotensive with rapid afib. She converted to sinus rhythm on a verapamil drip.  She was transitioned to oral diltiazem.  Hypotension improved once back in sinus rhythm.  She required short-term oxygen and diuresis. She was initially started on anticoagulation, but had a notable drop in her hemoglobin. Patient is a Restaurant manager, fast food, so following a discussion with the patient decision was made to discontinue anticoagulation as she was adamant that she did not want blood products in the event of worsening anemia.  PCP is Earley Brooke, PA-C with [facility]. Last visit 02/27/18. She does not have a cardiologist. (In reviewing her 03/2017 hospital records, it looks like she was managed by the Hospitalists and only PCP follow-up recommended.)   Meds include albuterol, Xanax, BuSpar, Cardizem CD, ferrous sulfate, Lasix, gabapentin, Norco, lisinopril, Prilosec.  Last EKG in [place] is from 04/14/17. Per result narrative, it showed ST at 103, frequent PACs, right BBB.   Echo 04/15/17 (done during admission for afib with RVR; see [place]): Interpretation Summary A complete two-dimensional transthoracic echocardiogram with color flow Doppler and spectral Doppler  was performed.  The mitral valve leaflets appear calcified, but not stenotic. There is mild [1+] aortic regurgitation present. There is moderate to moderately severe (2-3+) mitral regurgitation. The left ventricle is normal in size. There is mild (1+) tricuspid regurgitation. Right ventricular systolic pressure is elevated between 40-109mm Hg, consistent with moderate pulmonary hypertension. The left atrium is mildly dilated. The left ventricular wall motion is normal. The left ventricular ejection fraction is normal (50-55%). There is normal left ventricular wall thickness. Grade II moderate diastolic dysfunction; pseudonormal mitral inflow pattern. The aortic valve is not well visualized, but is grossly normal. (Comparison echo 10/10/09: EF 65-70%, trace MR, mild-moderate AR, RSVP 43 mmHg.)  Labs in Care Everywhere reviewed. She would need updated labs, but last HGB 12.0 and Cr 0.73 on 10/27/17. Urine culture on 04/02/18 showed < 10,000 colonies.   Patient is a same day work-up, due to being a late posting. She had a brief episode of afib with RVR last year. Echo showed normal LVEF and wall motion with moderate-severe MR, G2 diastolic dysfunction, and stable elevated RVSP. She denied SOB and CP per phone interviewing RN. Discussed above with anesthesiologist Dr. Renold Don. Her echo was within the past year, so if no symptoms concerning for progressive MR then would not be inclined to have it repeated prior to surgery. That said, she is a same day work-up, so she will get labs, EKG and anesthesiologist evaluation on the day of surgery. Definitive anesthesia plan following anesthesia evaluation. I have updated Nikki at Dr. Lacy Duverney office and notified her that notes indicate that patient is a Jehovah's Witness who in the past has refused blood products.  [facility]/Anesthesiology Phone [phone] 04/08/2018 3:01 PM

## 2018-04-09 ENCOUNTER — Inpatient Hospital Stay (HOSPITAL_COMMUNITY)
Admission: RE | Admit: 2018-04-09 | Discharge: 2018-04-11 | DRG: 520 | Disposition: A | Discharge status: Home / Self Care | Payer: Medicare Other | Source: Ambulatory Visit | Attending: Neurosurgery | Admitting: Neurosurgery

## 2018-04-09 ENCOUNTER — Inpatient Hospital Stay (HOSPITAL_COMMUNITY): Payer: Medicare Other | Admitting: Vascular Surgery

## 2018-04-09 ENCOUNTER — Inpatient Hospital Stay (HOSPITAL_COMMUNITY): Payer: Medicare Other

## 2018-04-09 ENCOUNTER — Encounter (HOSPITAL_COMMUNITY): Payer: Self-pay | Admitting: Urology

## 2018-04-09 ENCOUNTER — Encounter (HOSPITAL_COMMUNITY)
Admission: RE | Disposition: A | Discharge status: Home / Self Care | Payer: Self-pay | Source: Ambulatory Visit | Attending: Neurosurgery

## 2018-04-09 DIAGNOSIS — M1990 Unspecified osteoarthritis, unspecified site: Secondary | ICD-10-CM | POA: Diagnosis not present

## 2018-04-09 DIAGNOSIS — Z79899 Other long term (current) drug therapy: Secondary | ICD-10-CM | POA: Diagnosis not present

## 2018-04-09 DIAGNOSIS — Z87891 Personal history of nicotine dependence: Secondary | ICD-10-CM | POA: Diagnosis not present

## 2018-04-09 DIAGNOSIS — I499 Cardiac arrhythmia, unspecified: Secondary | ICD-10-CM | POA: Diagnosis not present

## 2018-04-09 DIAGNOSIS — I340 Nonrheumatic mitral (valve) insufficiency: Secondary | ICD-10-CM | POA: Diagnosis present

## 2018-04-09 DIAGNOSIS — Z9189 Other specified personal risk factors, not elsewhere classified: Secondary | ICD-10-CM

## 2018-04-09 DIAGNOSIS — Z981 Arthrodesis status: Secondary | ICD-10-CM | POA: Diagnosis not present

## 2018-04-09 DIAGNOSIS — F419 Anxiety disorder, unspecified: Secondary | ICD-10-CM | POA: Diagnosis not present

## 2018-04-09 DIAGNOSIS — F329 Major depressive disorder, single episode, unspecified: Secondary | ICD-10-CM | POA: Diagnosis not present

## 2018-04-09 DIAGNOSIS — I4891 Unspecified atrial fibrillation: Secondary | ICD-10-CM | POA: Diagnosis not present

## 2018-04-09 DIAGNOSIS — K219 Gastro-esophageal reflux disease without esophagitis: Secondary | ICD-10-CM | POA: Diagnosis present

## 2018-04-09 DIAGNOSIS — I10 Essential (primary) hypertension: Secondary | ICD-10-CM | POA: Diagnosis not present

## 2018-04-09 DIAGNOSIS — D321 Benign neoplasm of spinal meninges: Secondary | ICD-10-CM | POA: Diagnosis not present

## 2018-04-09 DIAGNOSIS — M48062 Spinal stenosis, lumbar region with neurogenic claudication: Secondary | ICD-10-CM | POA: Diagnosis present

## 2018-04-09 DIAGNOSIS — Z419 Encounter for procedure for purposes other than remedying health state, unspecified: Secondary | ICD-10-CM

## 2018-04-09 DIAGNOSIS — M7138 Other bursal cyst, other site: Secondary | ICD-10-CM | POA: Diagnosis present

## 2018-04-09 DIAGNOSIS — Z792 Long term (current) use of antibiotics: Secondary | ICD-10-CM | POA: Diagnosis not present

## 2018-04-09 DIAGNOSIS — Z79890 Hormone replacement therapy (postmenopausal): Secondary | ICD-10-CM

## 2018-04-09 DIAGNOSIS — Z791 Long term (current) use of non-steroidal anti-inflammatories (NSAID): Secondary | ICD-10-CM

## 2018-04-09 DIAGNOSIS — Z79891 Long term (current) use of opiate analgesic: Secondary | ICD-10-CM

## 2018-04-09 DIAGNOSIS — D509 Iron deficiency anemia, unspecified: Secondary | ICD-10-CM | POA: Diagnosis present

## 2018-04-09 HISTORY — DX: Other specified postprocedural states: Z98.890

## 2018-04-09 HISTORY — DX: Urinary tract infection, site not specified: N39.0

## 2018-04-09 HISTORY — DX: Nonrheumatic mitral (valve) insufficiency: I34.0

## 2018-04-09 HISTORY — DX: Paroxysmal atrial fibrillation: I48.0

## 2018-04-09 HISTORY — DX: Other specified postprocedural states: R11.2

## 2018-04-09 HISTORY — DX: Unspecified osteoarthritis, unspecified site: M19.90

## 2018-04-09 HISTORY — DX: Depression, unspecified: F32.A

## 2018-04-09 HISTORY — DX: Major depressive disorder, single episode, unspecified: F32.9

## 2018-04-09 HISTORY — PX: LUMBAR LAMINECTOMY/DECOMPRESSION MICRODISCECTOMY: SHX5026

## 2018-04-09 HISTORY — DX: Diverticulitis of intestine, part unspecified, without perforation or abscess without bleeding: K57.92

## 2018-04-09 HISTORY — DX: Age-related osteoporosis without current pathological fracture: M81.0

## 2018-04-09 HISTORY — DX: Anemia, unspecified: D64.9

## 2018-04-09 HISTORY — DX: Gastro-esophageal reflux disease without esophagitis: K21.9

## 2018-04-09 HISTORY — DX: Essential (primary) hypertension: I10

## 2018-04-09 HISTORY — DX: Anxiety disorder, unspecified: F41.9

## 2018-04-09 LAB — CBC
HEMATOCRIT: 34.5 % — AB (ref 36.0–46.0)
Hemoglobin: 11.7 g/dL — ABNORMAL LOW (ref 12.0–15.0)
MCH: 30.5 pg (ref 26.0–34.0)
MCHC: 33.9 g/dL (ref 30.0–36.0)
MCV: 90.1 fL (ref 78.0–100.0)
PLATELETS: 220 10*3/uL (ref 150–400)
RBC: 3.83 MIL/uL — ABNORMAL LOW (ref 3.87–5.11)
RDW: 14.7 % (ref 11.5–15.5)
WBC: 8.3 10*3/uL (ref 4.0–10.5)

## 2018-04-09 LAB — NO BLOOD PRODUCTS

## 2018-04-09 LAB — BASIC METABOLIC PANEL
Anion gap: 9 (ref 5–15)
BUN: 13 mg/dL (ref 6–20)
CALCIUM: 8.9 mg/dL (ref 8.9–10.3)
CO2: 22 mmol/L (ref 22–32)
CREATININE: 0.72 mg/dL (ref 0.44–1.00)
Chloride: 100 mmol/L — ABNORMAL LOW (ref 101–111)
GFR calc Af Amer: >60 mL/min (ref >60)
GFR calc non Af Amer: >60 mL/min (ref >60)
Glucose, Bld: 97 mg/dL (ref 65–99)
Potassium: 4.2 mmol/L (ref 3.5–5.1)
Sodium: 131 mmol/L — ABNORMAL LOW (ref 135–145)

## 2018-04-09 SURGERY — LUMBAR LAMINECTOMY/DECOMPRESSION MICRODISCECTOMY 1 LEVEL
Anesthesia: General | Site: Back | Laterality: Left

## 2018-04-09 MED ORDER — FUROSEMIDE 40 MG PO TABS
40.0000 mg | ORAL_TABLET | Freq: Every day | ORAL | Status: DC
  Administered 2018-04-10 – 2018-04-11 (×2): 40 mg via ORAL
  Filled 2018-04-09 (×3): qty 1

## 2018-04-09 MED ORDER — SUGAMMADEX SODIUM 200 MG/2ML IV SOLN
INTRAVENOUS | Status: DC | PRN
  Administered 2018-04-09: 200 mg via INTRAVENOUS

## 2018-04-09 MED ORDER — METHENAMINE HIPPURATE 1 G PO TABS: 1.0000 g | ORAL_TABLET | Freq: Two times a day (BID) | ORAL | Status: DC

## 2018-04-09 MED ORDER — HEMOSTATIC AGENTS (NO CHARGE) OPTIME
TOPICAL | Status: DC | PRN
  Administered 2018-04-09: 1 via TOPICAL

## 2018-04-09 MED ORDER — PROMETHAZINE HCL 25 MG/ML IJ SOLN: 6.2500 mg | INTRAMUSCULAR | Status: DC | PRN

## 2018-04-09 MED ORDER — BUPIVACAINE HCL (PF) 0.5 % IJ SOLN
INTRAMUSCULAR | Status: AC
  Filled 2018-04-09: qty 30

## 2018-04-09 MED ORDER — HYDROCODONE-ACETAMINOPHEN 7.5-325 MG PO TABS
2.0000 | ORAL_TABLET | ORAL | Status: DC | PRN
  Administered 2018-04-09 – 2018-04-10 (×2): 2 via ORAL
  Filled 2018-04-09 (×3): qty 2

## 2018-04-09 MED ORDER — PHENYLEPHRINE HCL 10 MG/ML IJ SOLN
INTRAVENOUS | Status: DC | PRN
  Administered 2018-04-09: 40 ug/min via INTRAVENOUS

## 2018-04-09 MED ORDER — PHENOL 1.4 % MT LIQD: 1.0000 | OROMUCOSAL | Status: DC | PRN

## 2018-04-09 MED ORDER — DEXAMETHASONE SODIUM PHOSPHATE 10 MG/ML IJ SOLN
INTRAMUSCULAR | Status: AC
  Filled 2018-04-09: qty 1

## 2018-04-09 MED ORDER — LACTATED RINGERS IV SOLN
INTRAVENOUS | Status: DC
  Administered 2018-04-09 (×2): via INTRAVENOUS

## 2018-04-09 MED ORDER — LIDOCAINE 2% (20 MG/ML) 5 ML SYRINGE
INTRAMUSCULAR | Status: AC
  Filled 2018-04-09: qty 5

## 2018-04-09 MED ORDER — MORPHINE SULFATE (PF) 4 MG/ML IV SOLN: 1.0000 mg | INTRAVENOUS | Status: DC | PRN

## 2018-04-09 MED ORDER — ALPRAZOLAM 0.5 MG PO TABS
0.5000 mg | ORAL_TABLET | Freq: Three times a day (TID) | ORAL | Status: DC | PRN
  Administered 2018-04-10 (×2): 0.5 mg via ORAL
  Filled 2018-04-09 (×2): qty 1

## 2018-04-09 MED ORDER — ARTIFICIAL TEARS OPHTHALMIC OINT
TOPICAL_OINTMENT | OPHTHALMIC | Status: AC
  Filled 2018-04-09: qty 7

## 2018-04-09 MED ORDER — SUGAMMADEX SODIUM 200 MG/2ML IV SOLN
INTRAVENOUS | Status: AC
  Filled 2018-04-09: qty 2

## 2018-04-09 MED ORDER — CYCLOBENZAPRINE HCL 10 MG PO TABS: 10.0000 mg | ORAL_TABLET | Freq: Three times a day (TID) | ORAL | Status: DC | PRN

## 2018-04-09 MED ORDER — POTASSIUM CHLORIDE IN NACL 20-0.9 MEQ/L-% IV SOLN
INTRAVENOUS | Status: DC
  Administered 2018-04-09: 20:00:00 via INTRAVENOUS
  Filled 2018-04-09: qty 1000

## 2018-04-09 MED ORDER — ROCURONIUM BROMIDE 10 MG/ML (PF) SYRINGE
PREFILLED_SYRINGE | INTRAVENOUS | Status: AC
  Filled 2018-04-09: qty 5

## 2018-04-09 MED ORDER — ACETAMINOPHEN 325 MG PO TABS
650.0000 mg | ORAL_TABLET | ORAL | Status: DC | PRN
  Administered 2018-04-11: 650 mg via ORAL
  Filled 2018-04-09: qty 2

## 2018-04-09 MED ORDER — LACTATED RINGERS IV SOLN: INTRAVENOUS | Status: DC

## 2018-04-09 MED ORDER — HYDROCODONE-ACETAMINOPHEN 5-325 MG PO TABS
1.0000 | ORAL_TABLET | ORAL | Status: DC | PRN
  Administered 2018-04-11: 1 via ORAL

## 2018-04-09 MED ORDER — ALBUTEROL SULFATE (2.5 MG/3ML) 0.083% IN NEBU: 2.5000 mg | INHALATION_SOLUTION | Freq: Four times a day (QID) | RESPIRATORY_TRACT | Status: DC | PRN

## 2018-04-09 MED ORDER — FENTANYL CITRATE (PF) 250 MCG/5ML IJ SOLN
INTRAMUSCULAR | Status: AC
  Filled 2018-04-09: qty 5

## 2018-04-09 MED ORDER — FENTANYL CITRATE (PF) 100 MCG/2ML IJ SOLN
INTRAMUSCULAR | Status: AC
  Filled 2018-04-09: qty 2

## 2018-04-09 MED ORDER — CEFAZOLIN SODIUM 1 G IJ SOLR
INTRAMUSCULAR | Status: AC
  Filled 2018-04-09: qty 20

## 2018-04-09 MED ORDER — MEPERIDINE HCL 50 MG/ML IJ SOLN: 6.2500 mg | INTRAMUSCULAR | Status: DC | PRN

## 2018-04-09 MED ORDER — PHENYLEPHRINE 40 MCG/ML (10ML) SYRINGE FOR IV PUSH (FOR BLOOD PRESSURE SUPPORT)
PREFILLED_SYRINGE | INTRAVENOUS | Status: AC
  Filled 2018-04-09: qty 10

## 2018-04-09 MED ORDER — MENTHOL 3 MG MT LOZG: 1.0000 | LOZENGE | OROMUCOSAL | Status: DC | PRN

## 2018-04-09 MED ORDER — CELECOXIB 200 MG PO CAPS: 200.0000 mg | ORAL_CAPSULE | Freq: Two times a day (BID) | ORAL | Status: DC

## 2018-04-09 MED ORDER — FENTANYL CITRATE (PF) 100 MCG/2ML IJ SOLN
25.0000 ug | INTRAMUSCULAR | Status: DC | PRN
  Administered 2018-04-09: 25 ug via INTRAVENOUS

## 2018-04-09 MED ORDER — THROMBIN 5000 UNITS EX SOLR
CUTANEOUS | Status: DC | PRN
  Administered 2018-04-09 (×2): 5000 [IU] via TOPICAL

## 2018-04-09 MED ORDER — DOCUSATE SODIUM 100 MG PO CAPS
100.0000 mg | ORAL_CAPSULE | Freq: Two times a day (BID) | ORAL | Status: DC
  Administered 2018-04-09 – 2018-04-11 (×4): 100 mg via ORAL
  Filled 2018-04-09 (×4): qty 1

## 2018-04-09 MED ORDER — VITAMIN B-12 1000 MCG PO TABS
1000.0000 ug | ORAL_TABLET | Freq: Every day | ORAL | Status: DC
  Administered 2018-04-09 – 2018-04-11 (×3): 1000 ug via ORAL
  Filled 2018-04-09 (×3): qty 1

## 2018-04-09 MED ORDER — LISINOPRIL 5 MG PO TABS
5.0000 mg | ORAL_TABLET | Freq: Every day | ORAL | Status: DC
  Administered 2018-04-10 – 2018-04-11 (×2): 5 mg via ORAL
  Filled 2018-04-09 (×2): qty 1

## 2018-04-09 MED ORDER — ONDANSETRON HCL 4 MG PO TABS: 4.0000 mg | ORAL_TABLET | Freq: Four times a day (QID) | ORAL | Status: DC | PRN

## 2018-04-09 MED ORDER — DILTIAZEM HCL ER COATED BEADS 120 MG PO CP24
120.0000 mg | ORAL_CAPSULE | Freq: Every day | ORAL | Status: DC
  Administered 2018-04-10 – 2018-04-11 (×2): 120 mg via ORAL
  Filled 2018-04-09 (×2): qty 1

## 2018-04-09 MED ORDER — LIDOCAINE-EPINEPHRINE 0.5 %-1:200000 IJ SOLN
INTRAMUSCULAR | Status: AC
  Filled 2018-04-09: qty 1

## 2018-04-09 MED ORDER — SODIUM CHLORIDE 0.9% FLUSH: 3.0000 mL | INTRAVENOUS | Status: DC | PRN

## 2018-04-09 MED ORDER — ONDANSETRON HCL 4 MG/2ML IJ SOLN
INTRAMUSCULAR | Status: AC
  Filled 2018-04-09: qty 2

## 2018-04-09 MED ORDER — NITROFURANTOIN MONOHYD MACRO 100 MG PO CAPS
100.0000 mg | ORAL_CAPSULE | Freq: Two times a day (BID) | ORAL | Status: DC
  Administered 2018-04-09 – 2018-04-11 (×4): 100 mg via ORAL
  Filled 2018-04-09 (×5): qty 1

## 2018-04-09 MED ORDER — SODIUM CHLORIDE 0.9% FLUSH
3.0000 mL | Freq: Two times a day (BID) | INTRAVENOUS | Status: DC
  Administered 2018-04-09 – 2018-04-11 (×4): 3 mL via INTRAVENOUS

## 2018-04-09 MED ORDER — DEXAMETHASONE SODIUM PHOSPHATE 4 MG/ML IJ SOLN
INTRAMUSCULAR | Status: DC | PRN
  Administered 2018-04-09: 10 mg via INTRAVENOUS

## 2018-04-09 MED ORDER — ARTIFICIAL TEARS OPHTHALMIC OINT
TOPICAL_OINTMENT | OPHTHALMIC | Status: DC | PRN
  Administered 2018-04-09: 1 via OPHTHALMIC

## 2018-04-09 MED ORDER — THROMBIN 5000 UNITS EX SOLR
CUTANEOUS | Status: AC
  Filled 2018-04-09: qty 10000

## 2018-04-09 MED ORDER — PANTOPRAZOLE SODIUM 40 MG PO TBEC
40.0000 mg | DELAYED_RELEASE_TABLET | Freq: Every day | ORAL | Status: DC
  Administered 2018-04-10 – 2018-04-11 (×2): 40 mg via ORAL
  Filled 2018-04-09 (×2): qty 1

## 2018-04-09 MED ORDER — PROPOFOL 10 MG/ML IV BOLUS
INTRAVENOUS | Status: DC | PRN
  Administered 2018-04-09: 80 mg via INTRAVENOUS

## 2018-04-09 MED ORDER — LIDOCAINE 2% (20 MG/ML) 5 ML SYRINGE
INTRAMUSCULAR | Status: DC | PRN
  Administered 2018-04-09: 40 mg via INTRAVENOUS

## 2018-04-09 MED ORDER — 0.9 % SODIUM CHLORIDE (POUR BTL) OPTIME
TOPICAL | Status: DC | PRN
  Administered 2018-04-09: 1000 mL

## 2018-04-09 MED ORDER — GABAPENTIN 300 MG PO CAPS
300.0000 mg | ORAL_CAPSULE | Freq: Three times a day (TID) | ORAL | Status: DC
  Administered 2018-04-09 – 2018-04-11 (×5): 300 mg via ORAL
  Filled 2018-04-09 (×5): qty 1

## 2018-04-09 MED ORDER — SODIUM CHLORIDE 0.9 % IV SOLN: 250.0000 mL | INTRAVENOUS | Status: DC

## 2018-04-09 MED ORDER — ACETAMINOPHEN 500 MG PO TABS
1000.0000 mg | ORAL_TABLET | Freq: Four times a day (QID) | ORAL | Status: AC
  Administered 2018-04-09: 1000 mg via ORAL
  Filled 2018-04-09 (×3): qty 2

## 2018-04-09 MED ORDER — LIDOCAINE-EPINEPHRINE 0.5 %-1:200000 IJ SOLN
INTRAMUSCULAR | Status: DC | PRN
  Administered 2018-04-09: 10 mL

## 2018-04-09 MED ORDER — FENTANYL CITRATE (PF) 100 MCG/2ML IJ SOLN
INTRAMUSCULAR | Status: DC | PRN
  Administered 2018-04-09 (×2): 50 ug via INTRAVENOUS
  Administered 2018-04-09: 25 ug via INTRAVENOUS

## 2018-04-09 MED ORDER — HEMOSTATIC AGENTS (NO CHARGE) OPTIME
TOPICAL | Status: DC | PRN
  Administered 2018-04-09 (×2): 1 via TOPICAL

## 2018-04-09 MED ORDER — POLYVINYL ALCOHOL 1.4 % OP SOLN
1.0000 [drp] | Freq: Three times a day (TID) | OPHTHALMIC | Status: DC | PRN
  Filled 2018-04-09: qty 15

## 2018-04-09 MED ORDER — FERROUS SULFATE 325 (65 FE) MG PO TABS
325.0000 mg | ORAL_TABLET | Freq: Two times a day (BID) | ORAL | Status: DC
  Administered 2018-04-10 – 2018-04-11 (×3): 325 mg via ORAL
  Filled 2018-04-09 (×3): qty 1

## 2018-04-09 MED ORDER — BUSPIRONE HCL 5 MG PO TABS
10.0000 mg | ORAL_TABLET | Freq: Two times a day (BID) | ORAL | Status: DC
  Administered 2018-04-09 – 2018-04-11 (×4): 10 mg via ORAL
  Filled 2018-04-09 (×4): qty 2

## 2018-04-09 MED ORDER — ONDANSETRON HCL 4 MG/2ML IJ SOLN
INTRAMUSCULAR | Status: DC | PRN
  Administered 2018-04-09: 4 mg via INTRAVENOUS

## 2018-04-09 MED ORDER — OXYCODONE HCL ER 10 MG PO T12A
10.0000 mg | EXTENDED_RELEASE_TABLET | Freq: Two times a day (BID) | ORAL | Status: DC
  Administered 2018-04-09 – 2018-04-10 (×2): 10 mg via ORAL
  Filled 2018-04-09 (×3): qty 1

## 2018-04-09 MED ORDER — PHENYLEPHRINE 40 MCG/ML (10ML) SYRINGE FOR IV PUSH (FOR BLOOD PRESSURE SUPPORT)
PREFILLED_SYRINGE | INTRAVENOUS | Status: DC | PRN
  Administered 2018-04-09 (×2): 80 ug via INTRAVENOUS

## 2018-04-09 MED ORDER — BUPIVACAINE HCL (PF) 0.5 % IJ SOLN
INTRAMUSCULAR | Status: DC | PRN
  Administered 2018-04-09: 10 mL

## 2018-04-09 MED ORDER — BIOTIN 2500 MCG PO CAPS: 1.0000 | ORAL_CAPSULE | Freq: Every day | ORAL | Status: DC

## 2018-04-09 MED ORDER — ONDANSETRON HCL 4 MG/2ML IJ SOLN
4.0000 mg | Freq: Four times a day (QID) | INTRAMUSCULAR | Status: DC | PRN
  Administered 2018-04-10: 4 mg via INTRAVENOUS
  Filled 2018-04-09: qty 2

## 2018-04-09 MED ORDER — ACETAMINOPHEN 650 MG RE SUPP: 650.0000 mg | RECTAL | Status: DC | PRN

## 2018-04-09 MED ORDER — ROCURONIUM BROMIDE 100 MG/10ML IV SOLN
INTRAVENOUS | Status: DC | PRN
  Administered 2018-04-09: 10 mg via INTRAVENOUS
  Administered 2018-04-09: 40 mg via INTRAVENOUS
  Administered 2018-04-09: 10 mg via INTRAVENOUS

## 2018-04-09 MED ORDER — CEFAZOLIN SODIUM-DEXTROSE 2-3 GM-%(50ML) IV SOLR
INTRAVENOUS | Status: DC | PRN
  Administered 2018-04-09: 2 g via INTRAVENOUS

## 2018-04-09 SURGICAL SUPPLY — 50 items
BENZOIN TINCTURE PRP APPL 2/3 (GAUZE/BANDAGES/DRESSINGS) IMPLANT
BLADE CLIPPER SURG (BLADE) IMPLANT
BUR MATCHSTICK NEURO 3.0 LAGG (BURR) ×3 IMPLANT
BUR PRECISION FLUTE 5.0 (BURR) IMPLANT
CANISTER SUCT 3000ML PPV (MISCELLANEOUS) ×3 IMPLANT
CARTRIDGE OIL MAESTRO DRILL (MISCELLANEOUS) ×1 IMPLANT
CLOSURE WOUND 1/2 X4 (GAUZE/BANDAGES/DRESSINGS)
DECANTER SPIKE VIAL GLASS SM (MISCELLANEOUS) ×3 IMPLANT
DERMABOND ADVANCED (GAUZE/BANDAGES/DRESSINGS) ×2
DERMABOND ADVANCED .7 DNX12 (GAUZE/BANDAGES/DRESSINGS) ×1 IMPLANT
DIFFUSER DRILL AIR PNEUMATIC (MISCELLANEOUS) ×3 IMPLANT
DRAPE LAPAROTOMY 100X72X124 (DRAPES) ×3 IMPLANT
DRAPE MICROSCOPE LEICA (MISCELLANEOUS) ×3 IMPLANT
DRAPE SURG 17X23 STRL (DRAPES) ×3 IMPLANT
DURAPREP 26ML APPLICATOR (WOUND CARE) ×3 IMPLANT
DURASEAL APPLICATOR TIP (TIP) ×3 IMPLANT
DURASEAL SPINE SEALANT 3ML (MISCELLANEOUS) ×6 IMPLANT
ELECT REM PT RETURN 9FT ADLT (ELECTROSURGICAL) ×3
ELECTRODE REM PT RTRN 9FT ADLT (ELECTROSURGICAL) ×1 IMPLANT
GAUZE SPONGE 4X4 12PLY STRL (GAUZE/BANDAGES/DRESSINGS) IMPLANT
GAUZE SPONGE 4X4 16PLY XRAY LF (GAUZE/BANDAGES/DRESSINGS) IMPLANT
GLOVE ECLIPSE 6.5 STRL STRAW (GLOVE) ×6 IMPLANT
GLOVE EXAM NITRILE LRG STRL (GLOVE) IMPLANT
GLOVE EXAM NITRILE XL STR (GLOVE) IMPLANT
GLOVE EXAM NITRILE XS STR PU (GLOVE) IMPLANT
GOWN STRL REUS W/ TWL LRG LVL3 (GOWN DISPOSABLE) ×2 IMPLANT
GOWN STRL REUS W/ TWL XL LVL3 (GOWN DISPOSABLE) IMPLANT
GOWN STRL REUS W/TWL 2XL LVL3 (GOWN DISPOSABLE) IMPLANT
GOWN STRL REUS W/TWL LRG LVL3 (GOWN DISPOSABLE) ×4
GOWN STRL REUS W/TWL XL LVL3 (GOWN DISPOSABLE)
GRAFT DURAGEN MATRIX 1WX1L (Tissue) ×3 IMPLANT
KIT BASIN OR (CUSTOM PROCEDURE TRAY) ×3 IMPLANT
KIT TURNOVER KIT B (KITS) ×3 IMPLANT
NEEDLE HYPO 25X1 1.5 SAFETY (NEEDLE) ×3 IMPLANT
NEEDLE SPNL 18GX3.5 QUINCKE PK (NEEDLE) IMPLANT
NS IRRIG 1000ML POUR BTL (IV SOLUTION) ×3 IMPLANT
OIL CARTRIDGE MAESTRO DRILL (MISCELLANEOUS) ×3
PACK LAMINECTOMY NEURO (CUSTOM PROCEDURE TRAY) ×3 IMPLANT
PAD ARMBOARD 7.5X6 YLW CONV (MISCELLANEOUS) ×9 IMPLANT
RUBBERBAND STERILE (MISCELLANEOUS) ×6 IMPLANT
SPONGE LAP 4X18 X RAY DECT (DISPOSABLE) IMPLANT
SPONGE SURGIFOAM ABS GEL SZ50 (HEMOSTASIS) ×3 IMPLANT
STRIP CLOSURE SKIN 1/2X4 (GAUZE/BANDAGES/DRESSINGS) IMPLANT
SUT VIC AB 0 CT1 18XCR BRD8 (SUTURE) ×1 IMPLANT
SUT VIC AB 0 CT1 8-18 (SUTURE) ×2
SUT VIC AB 2-0 CT1 18 (SUTURE) ×6 IMPLANT
SUT VIC AB 3-0 SH 8-18 (SUTURE) ×6 IMPLANT
TOWEL GREEN STERILE (TOWEL DISPOSABLE) ×3 IMPLANT
TOWEL GREEN STERILE FF (TOWEL DISPOSABLE) ×3 IMPLANT
WATER STERILE IRR 1000ML POUR (IV SOLUTION) ×3 IMPLANT

## 2018-04-09 NOTE — Anesthesia Procedure Notes (Signed)
Procedure Name: Intubation Date/Time: 04/09/2018 3:15 PM Performed by: Orlie Dakin, CRNA Pre-anesthesia Checklist: Patient identified, Emergency Drugs available, Suction available, Patient being monitored and Timeout performed Patient Re-evaluated:Patient Re-evaluated prior to induction Oxygen Delivery Method: Circle system utilized Preoxygenation: Pre-oxygenation with 100% oxygen Induction Type: IV induction Ventilation: Mask ventilation without difficulty Laryngoscope Size: Miller and 3 Grade View: Grade I Tube type: Oral Tube size: 7.0 mm Number of attempts: 1 Airway Equipment and Method: Stylet Placement Confirmation: ETT inserted through vocal cords under direct vision,  positive ETCO2 and breath sounds checked- equal and bilateral Secured at: 21 cm Tube secured with: Tape Dental Injury: Teeth and Oropharynx as per pre-operative assessment

## 2018-04-09 NOTE — H&P (Signed)
BP (!) 143/55   Pulse 71   Temp 98.2 F (36.8 C) (Oral)   Resp 18   Ht 5\' 1"  (1.549 m)   Wt 64.4 kg (142 lb)   SpO2 96%   BMI 26.83 kg/m  Deborah Robbins comes in today at the behest of Dr. Aundria Mems. She is sent for evaluation of stenosis in the back and lower extremity pain. She has had this pain now for approximately a year. She describes it as a toothache in the lower buttocks, is worse on the left side. She did undergo epidural steroid injections, which unfortunately have not been overly helpful. She is 84 years of age, right handed, does not use alcohol. No history of substance abuse. She also does not smoke. She gave no information about her parents. REVIEW OF SYSTEMS: Positive just for the pain in the left hip. She says the pain is more intense now than it had been previously. If she lies down on a heating pad, she says she is able to gain some relief. She is 5 feet 4 inches, weighs 144 pounds. Temperature is 97.8, blood pressure is 161/84, pulse is 84. Pain is 10/10. On exam, she is alert, oriented by 4. She answers all questions appropriately. Memory, language, attention span, and fund of knowledge are normal. She has essentially normal strength in the lower extremities and upper extremities. Proprioception is intact. Reflexes are also intact though diminished at the knees and ankles. She has normal strength in the upper extremities. Normal reflexes. Proprioception is also normal. MRI is reviewed. She is scoliotic. She has a fairly large synovial cyst at L5-S1 on the left side compromising the thecal sac. She also is markedly stenotic at L4-5. The rest of the spine while crooked, and while she has foraminal narrowing, is not, I don't believe causing this pain into the left hip. I recommended that she undergo decompression at 4-5 if her pain was that severe and to have the synovial cyst removed.  Deborah Robbins comes back today. The Dr. Owens Shark that I was referring to was  a surgeon, but the Dr. Owens Shark who had treated them was the radiologist. She comes back today and I still agree with my previous assessment; she is stenotic at 4-5, she has a large synovial cyst on the left at 5-1. Despite her advanced age, she is in a great deal of discomfort. I think that she is able to withstand a simple decompression of the synovial cyst and the decompression at 4-5. The risks and benefits include, but not limited to, CSF leak, bleeding, infection, no pain relief, damage to the nerve root, weakness in one or both extremities, bowel and/or bladder dysfunction were discussed. She understands and wishes to proceed.

## 2018-04-09 NOTE — Op Note (Signed)
BP (!) 132/57   Pulse 78   Temp 97.7 F (36.5 C)   Resp 12   Ht 5\' 1"  (1.549 m)   Wt 64.4 kg (142 lb)   SpO2 96%   BMI 26.83 kg/m  04/09/2018  6:53 PM  PATIENT:  Deborah Robbins  84 y.o. female  PRE-OPERATIVE DIAGNOSIS:  LUMBAR STENOSIS WITH NEUROGENIC CLAUDICATION L4/5 LumbAr synovial cyst Left L/S1  POST-OPERATIVE DIAGNOSIS:  LUMBAR STENOSIS WITH NEUROGENIC CLAUDICATION L4/5 Left lumbAr synovial cyst L5/S1  PROCEDURE:  Procedure(s): LAMINECTOMY FOR FACET/SYNOVIAL CYST LUMBAR FOUR   - LUMBAR FIVE , LUMBAR FIVE  - SACRAL ONE  LEFT  SURGEON: Surgeon(s): Ashok Pall, MD Newman Pies, MD  ASSISTANTS:Jenkins, Dellis Filbert  ANESTHESIA:   general  EBL:  Total I/O In: 1300 [I.V.:1300] Out: -   BLOOD ADMINISTERED:none  CELL SAVER GIVEN:none  COUNT:per nursing  DRAINS: none   SPECIMEN:  No Specimen  DICTATION: [name] was taken to the operating room, intubated, and placed under a general anesthetic without difficulty. She was positioned prone on a Wilson frame with all pressure points properly padded. Her lumbar region was prepped and draped in a sterile manner. I injected 10cc 1/2%lidocaine 1/200000 epinephrine into the planned incision. I opened the skin with a 10 blade and dissected sharply to the thoracolumbar ligament. I exposed bilaterally the L4, and L5 lamina, and the S1 lamina on the left side.  I used the drill and Kerrison punches to perform semihemilaminectomies of L5 bilaterally. I removed the ligamentum flavum, and overlying bone to decompress the thecal sac and the L4 nerve roots.  I then moved down to the L5/S1 level. I, with the retractor fractured the S1 spinous process. I removed the fractured segment. I used the drill to perform a semihemilaminectomy of S1 on the left. I opened the thecal sac, but the arachnoid stayed intact. I covered the arachnoid which did not leak csf during the case, with a dural substitute, duragen. We identified the arachnoid  cyst, removed its contents, and decompressed the thecal sac in the process. I placed dural sealant on the duragen. I then closed the wound, approximating the thoracolumbar fascia, subcutaneous and subcuticular planes with vicryl sutures. I applied a sterile dressing using dermabond.   PLAN OF CARE: Admit to inpatient   PATIENT DISPOSITION:  PACU - hemodynamically stable.   Delay start of Pharmacological VTE agent (>24hrs) due to surgical blood loss or risk of bleeding:  yes

## 2018-04-09 NOTE — Anesthesia Preprocedure Evaluation (Addendum)
Anesthesia Evaluation  Patient identified by MRN, date of birth, ID band Patient awake    Reviewed: Allergy & Precautions, NPO status , Patient's Chart, lab work & pertinent test results  History of Anesthesia Complications (+) PONV and history of anesthetic complications  Airway Mallampati: I  TM Distance: >3 FB Neck ROM: Full    Dental  (+) Upper Dentures, Lower Dentures   Pulmonary former smoker,    breath sounds clear to auscultation       Cardiovascular hypertension, Pt. on medications + dysrhythmias Atrial Fibrillation  Rhythm:Regular Rate:Normal     Neuro/Psych PSYCHIATRIC DISORDERS Anxiety Depression negative neurological ROS     GI/Hepatic Neg liver ROS, GERD  Medicated,  Endo/Other  negative endocrine ROS  Renal/GU negative Renal ROS  negative genitourinary   Musculoskeletal  (+) Arthritis ,   Abdominal Normal abdominal exam  (+)   Peds  Hematology  (+) anemia ,   Anesthesia Other Findings   Reproductive/Obstetrics                            Lab Results  Component Value Date   WBC 8.3 04/09/2018   HGB 11.7 (L) 04/09/2018   HCT 34.5 (L) 04/09/2018   MCV 90.1 04/09/2018   PLT 220 04/09/2018   Lab Results  Component Value Date   CREATININE 0.72 04/09/2018   BUN 13 04/09/2018   NA 131 (L) 04/09/2018   K 4.2 04/09/2018   CL 100 (L) 04/09/2018   CO2 22 04/09/2018   No results found for: INR, PROTIME  EKG: PAC's noted, frequent PVC's noted, SR.   Anesthesia Physical Anesthesia Plan  ASA: III  Anesthesia Plan: General   Post-op Pain Management:    Induction:   PONV Risk Score and Plan: 4 or greater and Ondansetron, Dexamethasone and Treatment may vary due to age or medical condition  Airway Management Planned: Oral ETT  Additional Equipment: None  Intra-op Plan:   Post-operative Plan: Extubation in OR  Informed Consent: I have reviewed the patients  History and Physical, chart, labs and discussed the procedure including the risks, benefits and alternatives for the proposed anesthesia with the patient or authorized representative who has indicated his/her understanding and acceptance.   Dental advisory given  Plan Discussed with: CRNA  Anesthesia Plan Comments:        Anesthesia Quick Evaluation

## 2018-04-09 NOTE — Transfer of Care (Signed)
Immediate Anesthesia Transfer of Care Note  Patient: Deborah Robbins  Procedure(s) Performed: LAMINECTOMY FOR FACET/SYNOVIAL CYST LUMBAR FOUR   - LUMBAR FIVE , LUMBAR FIVE  - SACRAL ONE  LEFT (Left Back)  Patient Location: PACU  Anesthesia Type:General  Level of Consciousness: awake, oriented and patient cooperative  Airway & Oxygen Therapy: Patient Spontanous Breathing and Patient connected to face mask oxygen  Post-op Assessment: Report given to RN, Post -op Vital signs reviewed and stable and Patient moving all extremities X 4  Post vital signs: Reviewed and stable  Last Vitals:  Vitals Value Taken Time  BP 160/70 04/09/2018  6:20 PM  Temp    Pulse 93 04/09/2018  6:22 PM  Resp 17 04/09/2018  6:22 PM  SpO2 99 % 04/09/2018  6:22 PM  Vitals shown include unvalidated device data.  Last Pain:  Vitals:   04/09/18 1213  TempSrc:   PainSc: 3       Patients Stated Pain Goal: 3 (94/07/68 0881)  Complications: No apparent anesthesia complications

## 2018-04-09 NOTE — Anesthesia Postprocedure Evaluation (Signed)
Anesthesia Post Note  Patient: Deborah Robbins  Procedure(s) Performed: LAMINECTOMY FOR FACET/SYNOVIAL CYST LUMBAR FOUR   - LUMBAR FIVE , LUMBAR FIVE  - SACRAL ONE  LEFT (Left Back)     Patient location during evaluation: PACU Anesthesia Type: General Level of consciousness: awake and alert Pain management: pain level controlled Vital Signs Assessment: post-procedure vital signs reviewed and stable Respiratory status: spontaneous breathing, nonlabored ventilation, respiratory function stable and patient connected to nasal cannula oxygen Cardiovascular status: blood pressure returned to baseline and stable Postop Assessment: no apparent nausea or vomiting Anesthetic complications: no    Last Vitals:  Vitals:   04/09/18 1920 04/09/18 1947  BP: (!) 137/56   Pulse: 63   Resp: 13   Temp:  (!) 36.2 C  SpO2: 96%     Last Pain:  Vitals:   04/09/18 1837  TempSrc:   PainSc: 6       LLE Sensation: Numbness(preop) (04/09/18 1947)   RLE Sensation: Numbness(preop) (04/09/18 1947)      [place]

## 2018-04-10 ENCOUNTER — Encounter (HOSPITAL_COMMUNITY): Payer: Self-pay | Admitting: Neurosurgery

## 2018-04-10 ENCOUNTER — Other Ambulatory Visit: Payer: Self-pay

## 2018-04-10 NOTE — Evaluation (Signed)
Occupational Therapy Evaluation Patient Details Name: Deborah Robbins MRN: [id number] DOB: [birth year] Today's Date: 04/10/2018    History of Present Illness 84 yo admitted for L4-S1 lami    Clinical Impression   Pt reports she was independent with ADL PTA. Currently pt requires close min guard assist for functional mobility due to posterior lean in standing with multiple LOB posteriorly during session. Pt requires min assist for LB ADL currently. Began education on ADL techniques and safety with mobility. Pt planning to d/c home with supervision from family. Pt would benefit from continued skilled OT to address established goals.    Follow Up Recommendations  Supervision - Intermittent    Equipment Recommendations  Tub/shower bench    Recommendations for Other Services       Precautions / Restrictions Precautions Precautions: Back;Fall Precaution Booklet Issued: No Precaution Comments: Pt able to recall 3/3 back precautions Restrictions Weight Bearing Restrictions: No      Mobility Bed Mobility Overal bed mobility: Needs Assistance Bed Mobility: Rolling;Sidelying to Sit;Sit to Sidelying Rolling: Supervision Sidelying to sit: Supervision     Sit to sidelying: Supervision General bed mobility comments: Cues for log roll technique. HOB flat with use of bed rails  Transfers Overall transfer level: Needs assistance Equipment used: Rolling walker (2 wheeled) Transfers: Sit to/from Stand Sit to Stand: Min guard         General transfer comment: Cues for hand placement, close min guard for safety due to posterior lean    Balance Overall balance assessment: Needs assistance Sitting-balance support: Feet supported Sitting balance-Leahy Scale: Good Sitting balance - Comments: sitting balance without UE support   Standing balance support: No upper extremity supported;During functional activity Standing balance-Leahy Scale: Poor Standing balance comment: min assist for  standing balance without UE support--pt with posterior lean in standing                           ADL either performed or assessed with clinical judgement   ADL Overall ADL's : Needs assistance/impaired Eating/Feeding: Set up;Sitting   Grooming: Minimal assistance;Standing;Wash/dry hands Grooming Details (indicate cue type and reason): Min assist for balance due to posterior lean in standing Upper Body Bathing: Set up;Supervision/ safety;Sitting   Lower Body Bathing: Min guard;Sit to/from stand   Upper Body Dressing : Set up;Supervision/safety;Sitting   Lower Body Dressing: Minimal assistance;Sit to/from stand Lower Body Dressing Details (indicate cue type and reason): Assist to start brief over feet. Pt able to cross foot over opposite knee. Educated on compensatory strategies for LB ADL Toilet Transfer: Min guard;Ambulation;Comfort height toilet;RW   Toileting- Water quality scientist and Hygiene: Min guard;Sit to/from stand Toileting - Clothing Manipulation Details (indicate cue type and reason): for balance when pulling up brief     Functional mobility during ADLs: Min guard;Rolling walker General ADL Comments: Educated pt on maintaining back precautions during functional activities, keeping frequently used items at counter top height, frequent mobility throughout the day upon return home, log roll for bed mobility     Vision         Perception     Praxis      Pertinent Vitals/Pain Pain Assessment: Faces Pain Score: 2  Faces Pain Scale: Hurts little more Pain Location: back Pain Descriptors / Indicators: Discomfort;Sore Pain Intervention(s): Monitored during session     Hand Dominance     Extremity/Trunk Assessment Upper Extremity Assessment Upper Extremity Assessment: Generalized weakness   Lower Extremity Assessment Lower Extremity Assessment: Defer to  PT evaluation   Cervical / Trunk Assessment Cervical / Trunk Assessment: Kyphotic    Communication Communication Communication: No difficulties   Cognition Arousal/Alertness: Awake/alert Behavior During Therapy: WFL for tasks assessed/performed Overall Cognitive Status: Within Functional Limits for tasks assessed                                     General Comments       Exercises     Shoulder Instructions      Home Living Family/patient expects to be discharged to:: Private residence Living Arrangements: Children Available Help at Discharge: Family;Available PRN/intermittently Type of Home: House Home Access: Level entry     Home Layout: One level     Bathroom Shower/Tub: Teacher, early years/pre: Handicapped height     Home Equipment: Environmental consultant - 4 wheels;Shower seat          Prior Functioning/Environment Level of Independence: Independent with assistive device(s)        Comments: 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        OT Problem List: Decreased strength;Impaired balance (sitting and/or standing);Decreased knowledge of use of DME or AE;Decreased knowledge of precautions;Pain      OT Treatment/Interventions: Self-care/ADL training;DME and/or AE instruction;Therapeutic activities;Patient/family education;Balance training    OT Goals(Current goals can be found in the care plan section) Acute Rehab OT Goals Patient Stated Goal: return to work OT Goal Formulation: With patient/family Time For Goal Achievement: 04/24/18 Potential to Achieve Goals: Good ADL Goals Pt Will Perform Lower Body Bathing: with supervision;sit to/from stand Pt Will Perform Lower Body Dressing: with supervision;sit to/from stand Pt Will Transfer to Toilet: with supervision;ambulating;bedside commode Pt Will Perform Toileting - Clothing Manipulation and hygiene: with supervision;sit to/from stand Additional ADL Goal #1: Pt will independently verbally recall 3/3 back precautions and maintain thorughout ADL.  OT Frequency: Min 2X/week    Barriers to D/C:            Co-evaluation              AM-PAC PT "6 Clicks" Daily Activity     Outcome Measure Help from another person eating meals?: A Little Help from another person taking care of personal grooming?: A Little Help from another person toileting, which includes using toliet, bedpan, or urinal?: A Little Help from another person bathing (including washing, rinsing, drying)?: A Little Help from another person to put on and taking off regular upper body clothing?: A Little Help from another person to put on and taking off regular lower body clothing?: A Little 6 Click Score: 18   End of Session Equipment Utilized During Treatment: Rolling walker  Activity Tolerance: Patient tolerated treatment well Patient left: in bed;with call bell/phone within reach;with family/visitor present  OT Visit Diagnosis: Unsteadiness on feet (R26.81);Muscle weakness (generalized) (M62.81);Pain Pain - part of body: (back)                Time: 2409-7353 OT Time Calculation (min): 25 min Charges:  OT General Charges $OT Visit: 1 Visit OT Evaluation $OT Eval Moderate Complexity: 1 Mod OT Treatments $Self Care/Home Management : 8-22 mins G-Codes:     Jadi Deyarmin A. Ulice Brilliant, M.S., OTR/L Pager: [facility] 04/10/2018, 1:31 PM

## 2018-04-10 NOTE — Progress Notes (Signed)
Patient ID: Deborah Robbins, female   DOB: 1929-05-16, 84 y.o.   MRN: [id number] BP (!) 131/56 (BP Location: Right Arm)   Pulse 60   Temp 97.8 F (36.6 C) (Oral)   Resp 14   Ht 5\' 1"  (1.549 m)   Wt 68.9 kg (151 lb 14.4 oz)   SpO2 96%   BMI 28.70 kg/m  Alert and oriented x 4, speech is clear and fluent Moving all extremities well Wound is clean, and dry, no signs of infection Possible discharge tomorrow

## 2018-04-10 NOTE — Evaluation (Signed)
Physical Therapy Evaluation Patient Details Name: Deborah Robbins MRN: [id number] DOB: 07-23-1929 Today's Date: 04/10/2018   History of Present Illness  84 yo admitted for L4-S1 lami   Clinical Impression  Pt very pleasant and moving well. 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 Pt educated for all precautions with handout provided and daughter present for education. Pt with posterior LOB with initial mobility which improved after gait and encouraged further gait with staff today. Pt with decreased activity tolerance and gait who will benefit from acute therapy to maximize mobility and safety to adhere to precautions and decrease fall risk for D/C.      Follow Up Recommendations Supervision for mobility/OOB    Equipment Recommendations  Other (comment)(tub bench)    Recommendations for Other Services OT consult     Precautions / Restrictions Precautions Precautions: Back;Fall Precaution Booklet Issued: Yes (comment)      Mobility  Bed Mobility Overal bed mobility: Needs Assistance Bed Mobility: Rolling;Sidelying to Sit;Sit to Sidelying Rolling: Supervision Sidelying to sit: Supervision     Sit to sidelying: Supervision General bed mobility comments: cues for sequence and precautions  Transfers Overall transfer level: Needs assistance   Transfers: Sit to/from Stand Sit to Stand: Min assist         General transfer comment: min assist to stand from toilet with initial posterior lean. cues for safety  Ambulation/Gait Ambulation/Gait assistance: Min assist Ambulation Distance (Feet): 200 Feet Assistive device: Rolling walker (2 wheeled) Gait Pattern/deviations: Step-through pattern;Decreased stride length   Gait velocity interpretation: Below normal speed for age/gender General Gait Details: cues for posture initially with good stability in RW. Pt with decreased activity tolerance  Stairs            Wheelchair  Mobility    Modified Rankin (Stroke Patients Only)       Balance Overall balance assessment: Needs assistance   Sitting balance-Leahy Scale: Good Sitting balance - Comments: sitting balance without UE support     Standing balance-Leahy Scale: Fair Standing balance comment: pt with posterior LOB x 4 with standing from toilet, washing hands at sink x 2 and initial gait with min assist to correct                             Pertinent Vitals/Pain Pain Assessment: 0-10 Pain Score: 2  Pain Location: back Pain Descriptors / Indicators: Aching Pain Intervention(s): Limited activity within patient's tolerance    Home Living Family/patient expects to be discharged to:: Private residence Living Arrangements: Children Available Help at Discharge: Family;Available PRN/intermittently Type of Home: House Home Access: Level entry     Home Layout: One level Home Equipment: Walker - 4 wheels;Shower seat      Prior Function Level of Independence: Independent with assistive device(s)         Comments: 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        Extremity/Trunk Assessment   Upper Extremity Assessment Upper Extremity Assessment: Generalized weakness    Lower Extremity Assessment Lower Extremity Assessment: Generalized weakness    Cervical / Trunk Assessment Cervical / Trunk Assessment: Kyphotic  Communication   Communication: No difficulties  Cognition Arousal/Alertness: Awake/alert Behavior During Therapy: WFL for tasks assessed/performed Overall Cognitive Status: Within Functional Limits for tasks assessed  General Comments      Exercises     Assessment/Plan    PT Assessment Patient needs continued PT services  PT Problem List Decreased mobility;Decreased activity tolerance;Decreased balance;Decreased knowledge of use of DME;Decreased safety awareness;Decreased  knowledge of precautions       PT Treatment Interventions Gait training;Patient/family education;Balance training;Functional mobility training;DME instruction;Therapeutic activities    PT Goals (Current goals can be found in the Care Plan section)  Acute Rehab PT Goals Patient Stated Goal: return to work PT Goal Formulation: With patient/family Time For Goal Achievement: 04/17/18 Potential to Achieve Goals: Fair    Frequency Min 5X/week   Barriers to discharge Decreased caregiver support      Co-evaluation               AM-PAC PT "6 Clicks" Daily Activity  Outcome Measure Difficulty turning over in bed (including adjusting bedclothes, sheets and blankets)?: A Little Difficulty moving from lying on back to sitting on the side of the bed? : A Little Difficulty sitting down on and standing up from a chair with arms (e.g., wheelchair, bedside commode, etc,.)?: A Little Help needed moving to and from a bed to chair (including a wheelchair)?: A Little Help needed walking in hospital room?: A Little Help needed climbing 3-5 steps with a railing? : A Little 6 Click Score: 18    End of Session   Activity Tolerance: Patient tolerated treatment well Patient left: in chair;with call bell/phone within reach;with family/visitor present Nurse Communication: Mobility status;Precautions PT Visit Diagnosis: Other abnormalities of gait and mobility (R26.89)    Time: 2878-6767 PT Time Calculation (min) (ACUTE ONLY): 16 min   Charges:   PT Evaluation $PT Eval Moderate Complexity: 1 Mod     PT G Codes:        Elwyn Reach, PT [phone]   Justin Meisenheimer B Dorthea Maina 04/10/2018, 10:03 AM

## 2018-04-11 MED ORDER — HYDROCODONE-ACETAMINOPHEN 5-325 MG PO TABS: 1.0000 | ORAL_TABLET | ORAL | 0 refills | Status: DC | PRN

## 2018-04-11 MED ORDER — CYCLOBENZAPRINE HCL 10 MG PO TABS: 10.0000 mg | ORAL_TABLET | Freq: Three times a day (TID) | ORAL | 2 refills | Status: DC | PRN

## 2018-04-11 NOTE — Discharge Summary (Signed)
Physician Discharge Summary  Patient ID: Deborah Robbins MRN: [id number] DOB/AGE: 05-21-1929 84 y.o.  Admit date: 04/09/2018 Discharge date: 04/11/2018  Admission Diagnoses:  Lumbar stenosis with neurogenic claudiction  Discharge Diagnoses:  Same Active Problems:   Lumbar stenosis with neurogenic claudication   Discharged Condition: Stable  Hospital Course:  Deborah Robbins is a 84 y.o. female who was admitted for the below procedure. There were no post operative complications. At time of discharge, pain was well controlled, ambulating with Pt/OT, tolerating po, voiding normal. Ready for discharge.  Treatments: Surgery LAMINECTOMY FOR FACET/SYNOVIAL CYST LUMBAR FOUR   - LUMBAR FIVE , LUMBAR FIVE  - SACRAL ONE  LEFT  Discharge Exam: Blood pressure (!) 91/56, pulse 76, temperature 99.6 F (37.6 C), temperature source Oral, resp. rate 14, height 5\' 1"  (1.549 m), weight 68.9 kg (151 lb 14.4 oz), SpO2 94 %. Awake, alert, oriented Speech fluent, appropriate CN grossly intact 5/5 BUE/BLE Wound c/d/i  Disposition: Discharge disposition: 01-Home or Self Care       Discharge Instructions    Call MD for:  difficulty breathing, headache or visual disturbances   Complete by:  As directed    Call MD for:  persistant dizziness or light-headedness   Complete by:  As directed    Call MD for:  redness, tenderness, or signs of infection (pain, swelling, redness, odor or green/yellow discharge around incision site)   Complete by:  As directed    Call MD for:  severe uncontrolled pain   Complete by:  As directed    Call MD for:  temperature >100.4   Complete by:  As directed    Diet general   Complete by:  As directed    Driving Restrictions   Complete by:  As directed    Do not drive until given clearance.   Increase activity slowly   Complete by:  As directed    Lifting restrictions   Complete by:  As directed    Do not lift anything >10lbs. Avoid bending and twisting in  awkward positions. Avoid bending at the back.   May shower / Bathe   Complete by:  As directed    In 24 hours. Okay to wash wound with warm soapy water. Avoid scrubbing the wound. Pat dry.   Remove dressing in 24 hours   Complete by:  As directed      Allergies as of 04/11/2018   No Known Allergies     Medication List    STOP taking these medications   celecoxib 200 MG capsule Commonly known as:  CELEBREX   meloxicam 15 MG tablet Commonly known as:  MOBIC   traMADol 50 MG tablet Commonly known as:  ULTRAM     TAKE these medications   albuterol 108 (90 Base) MCG/ACT inhaler Commonly known as:  PROVENTIL HFA;VENTOLIN HFA Inhale into the lungs.   ALPRAZolam 0.5 MG tablet Commonly known as:  XANAX Take 0.5 mg by mouth 3 (three) times daily as needed.   Biotin 2500 MCG Caps Take 1 tablet by mouth daily.   busPIRone 10 MG tablet Commonly known as:  BUSPAR Take 10 mg by mouth 2 (two) times daily.   carboxymethylcellulose 0.5 % Soln Commonly known as:  REFRESH PLUS Place 1 drop into both eyes 3 (three) times daily as needed (dry eyes).   cyclobenzaprine 10 MG tablet Commonly known as:  FLEXERIL Take 1 tablet (10 mg total) by mouth 3 (three) times daily as needed for muscle spasms.  diltiazem 120 MG 24 hr capsule Commonly known as:  CARDIZEM CD Take 120 mg by mouth daily.   estradiol 0.1 MG/GM vaginal cream Commonly known as:  ESTRACE Place 2 g vaginally 2 (two) times a week.   ferrous sulfate 325 (65 FE) MG tablet Take 325 mg by mouth 2 (two) times daily with a meal.   furosemide 40 MG tablet Commonly known as:  LASIX Take 40 mg by mouth daily.   gabapentin 100 MG capsule Commonly known as:  NEURONTIN 1 capsule at bedtime for 1 week then twice a day   HYDROcodone-acetaminophen 5-325 MG tablet Commonly known as:  NORCO/VICODIN Take 1 tablet by mouth every 4 (four) hours as needed.   ibuprofen 200 MG tablet Commonly known as:  ADVIL,MOTRIN Take 800 mg  by mouth as needed for moderate pain.   lisinopril 5 MG tablet Commonly known as:  PRINIVIL,ZESTRIL Take by mouth daily.   methenamine 1 g tablet Commonly known as:  HIPREX Take 1 g by mouth 2 (two) times daily with a meal.   nitrofurantoin (macrocrystal-monohydrate) 100 MG capsule Commonly known as:  MACROBID Take 100 mg by mouth 2 (two) times daily.   omeprazole 20 MG capsule Commonly known as:  PRILOSEC Take by mouth daily.   vitamin B-12 1000 MCG tablet Commonly known as:  CYANOCOBALAMIN Take by mouth daily.   Vitamin D3 2000 units capsule Take by mouth daily.      Follow-up Information    Ashok Pall, MD Follow up.   Specialty:  Neurosurgery Contact information: [address] [place] [address] [phone]           Signed: Traci Sermon 04/11/2018, 9:02 AM

## 2018-04-11 NOTE — Progress Notes (Signed)
Occupational Therapy Treatment Patient Details Name: Deborah Robbins MRN: [id number] DOB: [birth year] Today's Date: 04/11/2018    History of present illness 84 yo admitted for L4-S1 lami    OT comments  Pt progressing towards established OT goals. Pt demonstrating and verbalizing understanding of compensatory techniques for LB dressing, toileting, and oral care at sink. Providing education on safe tub transfer and pt performing simulated tub transfer with Min A for safety. Discussing with daughter that pt requires supervision and support during tub transfer at home; both pt and daughter verbalized understanding. Continue to recommend dc home once medically stable per physician. All education and questions answered in preparation for dc later today.    Follow Up Recommendations  Supervision - Intermittent    Equipment Recommendations  Other (comment)(Pt and daughter reporting they do not want tub bench)    Recommendations for Other Services      Precautions / Restrictions Precautions Precautions: Back;Fall Precaution Booklet Issued: No Precaution Comments: Pt able to recall 3/3 back precautions Restrictions Weight Bearing Restrictions: No       Mobility Bed Mobility Overal bed mobility: Needs Assistance Bed Mobility: Rolling;Sit to Sidelying Rolling: Supervision       Sit to sidelying: Supervision General bed mobility comments: Cues for log roll technique. HOB flat with use of bed rails  Transfers Overall transfer level: Needs assistance Equipment used: Rolling walker (2 wheeled) Transfers: Sit to/from Stand Sit to Stand: Min guard         General transfer comment: Cues for hand placement, close min guard for safety due to posterior lean    Balance Overall balance assessment: Needs assistance Sitting-balance support: Feet supported Sitting balance-Leahy Scale: Good Sitting balance - Comments: sitting balance without UE support   Standing balance support: No upper  extremity supported;During functional activity Standing balance-Leahy Scale: Fair Standing balance comment: Able to stand without UE support                           ADL either performed or assessed with clinical judgement   ADL Overall ADL's : Needs assistance/impaired       Grooming Details (indicate cue type and reason): Reviewed compensatory tehcniques for oral care. Pt recalling all steps and techniques               Lower Body Dressing Details (indicate cue type and reason): Pt demonstrating understanding and able to bring ankles to knees. Pt verbalizing steps for donning underwear, pants, and socks/shoes.  Toilet Transfer: Min guard;Ambulation;RW(Simulated in recliner)       Tub/ Shower Transfer: Minimal assistance;Ambulation;Tub transfer;Shower Technical sales engineer Details (indicate cue type and reason): Educating pt on safe tub transfer. Pt performing with simulated tub and MIn A for balance. Discussing need for supervision at home from daughters during tub transfers. Functional mobility during ADLs: Min guard;Rolling walker General ADL Comments: Pt demonstrating good understanding of compensatory techniques for LB dressing, toileting, bed mobility, and tub transfer.      Vision       Perception     Praxis      Cognition Arousal/Alertness: Awake/alert Behavior During Therapy: WFL for tasks assessed/performed Overall Cognitive Status: Within Functional Limits for tasks assessed                                          Exercises  Shoulder Instructions       General Comments Daughter present throughout session    Pertinent Vitals/ Pain       Pain Assessment: 0-10 Pain Score: 8  Pain Location: back Pain Descriptors / Indicators: Discomfort;Sore Pain Intervention(s): Monitored during session;Limited activity within patient's tolerance;Repositioned  Home Living                                           Prior Functioning/Environment              Frequency  Min 2X/week        Progress Toward Goals  OT Goals(current goals can now be found in the care plan section)  Progress towards OT goals: Progressing toward goals  Acute Rehab OT Goals Patient Stated Goal: return to work OT Goal Formulation: With patient/family Time For Goal Achievement: 04/24/18 Potential to Achieve Goals: Good ADL Goals Pt Will Perform Lower Body Bathing: with supervision;sit to/from stand Pt Will Perform Lower Body Dressing: with supervision;sit to/from stand Pt Will Transfer to Toilet: with supervision;ambulating;bedside commode Pt Will Perform Toileting - Clothing Manipulation and hygiene: with supervision;sit to/from stand Additional ADL Goal #1: Pt will independently verbally recall 3/3 back precautions and maintain thorughout ADL.  Plan Discharge plan remains appropriate    Co-evaluation                 AM-PAC PT "6 Clicks" Daily Activity     Outcome Measure   Help from another person eating meals?: None Help from another person taking care of personal grooming?: A Little Help from another person toileting, which includes using toliet, bedpan, or urinal?: A Little Help from another person bathing (including washing, rinsing, drying)?: A Little Help from another person to put on and taking off regular upper body clothing?: A Little Help from another person to put on and taking off regular lower body clothing?: A Little 6 Click Score: 19    End of Session Equipment Utilized During Treatment: Rolling walker;Gait belt  OT Visit Diagnosis: Unsteadiness on feet (R26.81);Muscle weakness (generalized) (M62.81);Pain Pain - part of body: (back)   Activity Tolerance Patient tolerated treatment well   Patient Left in bed;with call bell/phone within reach;with family/visitor present   Nurse Communication Mobility status;Precautions        Time: 5170-0174 OT Time  Calculation (min): 17 min  Charges: OT General Charges $OT Visit: 1 Visit OT Treatments $Self Care/Home Management : 8-22 mins  [place], OTR/L Acute Rehab Pager: [phone] Office: [place] 04/11/2018, 10:36 AM

## 2018-04-11 NOTE — Progress Notes (Signed)
Physical Therapy Treatment Patient Details Name: Deborah Robbins MRN: [id number] DOB: [birth year] Today's Date: 04/11/2018    History of Present Illness Pt is an 84 yo female admitted for spinal stenosis with neurogenic claudication s/p  L4-S1 laminectomy. Pertinent PMH includes osteoporosis, HTN, PAF (paroxysmal atrial fibrillation), and arthritis.     PT Comments    Pt reported 0 out of 10 pain at the beginning of the therapy session and reported no pain throughout session. Pt was able to demonstrate knowledge of her lumbar precautions during transfers supine to sit and sit to supine. She demonstrated some loss of balance while walking in bathroom with RW when balance was challenged by ramp. Pt's tendency towards a posterior lean and excessive lateral sway with gait make it difficult for her to regain her balance when balance is challenged. Pt is used to a rollator instead of a RW which further challenged her gait, but she was still able to walk 80 ft with +1 Min guard for potential loss of balance.  Follow Up Recommendations  Supervision for mobility/OOB     Equipment Recommendations  Rolling walker with 5" wheels    Recommendations for Other Services       Precautions / Restrictions Precautions Precautions: Back;Fall Precaution Booklet Issued: No Precaution Comments: Pt able to recall 3/3 back precautions and perform supine to stand transfers without breaking precautions Restrictions Weight Bearing Restrictions: No    Mobility  Bed Mobility Overal bed mobility: Needs Assistance Bed Mobility: Supine to Sit           General bed mobility comments: Cues for log roll technique.   Transfers Overall transfer level: Needs assistance Equipment used: Rolling walker (2 wheeled) Transfers: Sit to/from Stand Sit to Stand: Min guard         General transfer comment: Pt did not require physical assistance for sit to stand transfer. +1 Min guard for potential need to steady pt  due to posterior trunk lean tendency.  Ambulation/Gait Ambulation/Gait assistance: Min guard   Assistive device: Rolling walker (2 wheeled) Gait Pattern/deviations: Decreased step length - right;Decreased step length - left;Decreased stride length;Leaning posteriorly;Trunk flexed     General Gait Details: Pt sometimes goes into posterior lean, especially if balance was challenged. Pt also exhibited excessive lateral sway with gait. Pt usually uses rollator instead of RW.    Stairs Stairs: Yes Stairs assistance: Min guard Stair Management: Forwards;Alternating pattern;Two rails Number of Stairs: 2(went up 2 stairs and down 2 stairs)        Balance Overall balance assessment: Needs assistance Sitting-balance support: Feet supported;Bilateral upper extremity supported Sitting balance-Leahy Scale: Good   Postural control: Posterior lean(Posterior lean with dynamic standing activities) Standing balance support: Bilateral upper extremity supported Standing balance-Leahy Scale: Fair Standing balance comment: Pt loss of balance requiring +1 for steadying in the bathroom when balance was challenged ie when pt forgot about bathroom ramp.                            Cognition Arousal/Alertness: Awake/alert Behavior During Therapy: WFL for tasks assessed/performed Overall Cognitive Status: Within Functional Limits for tasks assessed                                               Pertinent Vitals/Pain Pain Assessment: 0-10 Pain Score: 3  Pain Location: back(over  incision site) Pain Descriptors / Indicators: Discomfort;Sore           PT Goals (current goals can now be found in the care plan section) Acute Rehab PT Goals Patient Stated Goal: to go home and return to work PT Goal Formulation: With patient/family Time For Goal Achievement: 04/17/18 Potential to Achieve Goals: Good    Frequency    Min 5X/week      PT Plan Current plan remains  appropriate       AM-PAC PT "6 Clicks" Daily Activity  Outcome Measure  Difficulty turning over in bed (including adjusting bedclothes, sheets and blankets)?: None Difficulty moving from lying on back to sitting on the side of the bed? : None Difficulty sitting down on and standing up from a chair with arms (e.g., wheelchair, bedside commode, etc,.)?: None Help needed moving to and from a bed to chair (including a wheelchair)?: A Little Help needed walking in hospital room?: A Little Help needed climbing 3-5 steps with a railing? : A Little 6 Click Score: 21    End of Session Equipment Utilized During Treatment: Gait belt Activity Tolerance: Patient tolerated treatment well(No report of new or increased pain during session) Patient left: in bed;with call bell/phone within reach;with family/visitor present   PT Visit Diagnosis: Unsteadiness on feet (R26.81);Other abnormalities of gait and mobility (R26.89)     Time:  1124- 1146    Charges:    Gait Training         Kaelea Gathright B. [place], [place], DPT [phone]            04/11/2018, 4:08 PM

## 2018-04-11 NOTE — Progress Notes (Signed)
Spoke w patient she declined tub bench, has shower seat she is currently using and tub bench is not covered by her insurance.

## 2018-05-08 DIAGNOSIS — I10 Essential (primary) hypertension: Secondary | ICD-10-CM | POA: Diagnosis not present

## 2018-05-08 DIAGNOSIS — D500 Iron deficiency anemia secondary to blood loss (chronic): Secondary | ICD-10-CM | POA: Diagnosis not present

## 2018-05-08 DIAGNOSIS — Z9889 Other specified postprocedural states: Secondary | ICD-10-CM | POA: Diagnosis not present

## 2018-05-08 DIAGNOSIS — R8290 Unspecified abnormal findings in urine: Secondary | ICD-10-CM | POA: Diagnosis not present

## 2018-05-08 DIAGNOSIS — D492 Neoplasm of unspecified behavior of bone, soft tissue, and skin: Secondary | ICD-10-CM | POA: Diagnosis not present

## 2018-05-08 DIAGNOSIS — F418 Other specified anxiety disorders: Secondary | ICD-10-CM | POA: Diagnosis not present

## 2018-05-08 DIAGNOSIS — M810 Age-related osteoporosis without current pathological fracture: Secondary | ICD-10-CM | POA: Diagnosis not present

## 2018-05-08 DIAGNOSIS — K219 Gastro-esophageal reflux disease without esophagitis: Secondary | ICD-10-CM | POA: Diagnosis not present

## 2018-05-08 DIAGNOSIS — L821 Other seborrheic keratosis: Secondary | ICD-10-CM | POA: Diagnosis not present

## 2018-05-14 DIAGNOSIS — F331 Major depressive disorder, recurrent, moderate: Secondary | ICD-10-CM | POA: Diagnosis not present

## 2018-05-14 DIAGNOSIS — F411 Generalized anxiety disorder: Secondary | ICD-10-CM | POA: Diagnosis not present

## 2018-05-16 DIAGNOSIS — R000 Tachycardia, unspecified: Secondary | ICD-10-CM | POA: Diagnosis not present

## 2018-05-16 DIAGNOSIS — R21 Rash and other nonspecific skin eruption: Secondary | ICD-10-CM | POA: Diagnosis not present

## 2018-05-17 DIAGNOSIS — I4510 Unspecified right bundle-branch block: Secondary | ICD-10-CM | POA: Diagnosis not present

## 2018-05-17 DIAGNOSIS — R000 Tachycardia, unspecified: Secondary | ICD-10-CM | POA: Diagnosis not present

## 2018-05-21 DIAGNOSIS — F331 Major depressive disorder, recurrent, moderate: Secondary | ICD-10-CM | POA: Diagnosis not present

## 2018-05-21 DIAGNOSIS — F411 Generalized anxiety disorder: Secondary | ICD-10-CM | POA: Diagnosis not present

## 2018-05-22 DIAGNOSIS — R319 Hematuria, unspecified: Secondary | ICD-10-CM | POA: Diagnosis not present

## 2018-05-28 DIAGNOSIS — F411 Generalized anxiety disorder: Secondary | ICD-10-CM | POA: Diagnosis not present

## 2018-05-28 DIAGNOSIS — F331 Major depressive disorder, recurrent, moderate: Secondary | ICD-10-CM | POA: Diagnosis not present

## 2018-06-04 DIAGNOSIS — F411 Generalized anxiety disorder: Secondary | ICD-10-CM | POA: Diagnosis not present

## 2018-06-04 DIAGNOSIS — F331 Major depressive disorder, recurrent, moderate: Secondary | ICD-10-CM | POA: Diagnosis not present

## 2018-06-08 DIAGNOSIS — R300 Dysuria: Secondary | ICD-10-CM | POA: Diagnosis not present

## 2018-06-08 DIAGNOSIS — R3129 Other microscopic hematuria: Secondary | ICD-10-CM | POA: Diagnosis not present

## 2018-06-08 DIAGNOSIS — N390 Urinary tract infection, site not specified: Secondary | ICD-10-CM | POA: Diagnosis not present

## 2018-06-09 DIAGNOSIS — R3129 Other microscopic hematuria: Secondary | ICD-10-CM | POA: Diagnosis not present

## 2018-06-17 DIAGNOSIS — F411 Generalized anxiety disorder: Secondary | ICD-10-CM | POA: Diagnosis not present

## 2018-06-17 DIAGNOSIS — F331 Major depressive disorder, recurrent, moderate: Secondary | ICD-10-CM | POA: Diagnosis not present

## 2018-06-25 DIAGNOSIS — F411 Generalized anxiety disorder: Secondary | ICD-10-CM | POA: Diagnosis not present

## 2018-06-25 DIAGNOSIS — F331 Major depressive disorder, recurrent, moderate: Secondary | ICD-10-CM | POA: Diagnosis not present

## 2018-07-06 DIAGNOSIS — F411 Generalized anxiety disorder: Secondary | ICD-10-CM | POA: Diagnosis not present

## 2018-07-06 DIAGNOSIS — F331 Major depressive disorder, recurrent, moderate: Secondary | ICD-10-CM | POA: Diagnosis not present

## 2018-07-09 DIAGNOSIS — R9431 Abnormal electrocardiogram [ECG] [EKG]: Secondary | ICD-10-CM | POA: Diagnosis not present

## 2018-07-09 DIAGNOSIS — I481 Persistent atrial fibrillation: Secondary | ICD-10-CM | POA: Diagnosis not present

## 2018-07-09 DIAGNOSIS — I340 Nonrheumatic mitral (valve) insufficiency: Secondary | ICD-10-CM | POA: Insufficient documentation

## 2018-07-09 DIAGNOSIS — I10 Essential (primary) hypertension: Secondary | ICD-10-CM | POA: Diagnosis not present

## 2018-07-13 DIAGNOSIS — L97511 Non-pressure chronic ulcer of other part of right foot limited to breakdown of skin: Secondary | ICD-10-CM | POA: Diagnosis not present

## 2018-07-16 DIAGNOSIS — F331 Major depressive disorder, recurrent, moderate: Secondary | ICD-10-CM | POA: Diagnosis not present

## 2018-07-16 DIAGNOSIS — F411 Generalized anxiety disorder: Secondary | ICD-10-CM | POA: Diagnosis not present

## 2018-07-20 DIAGNOSIS — N390 Urinary tract infection, site not specified: Secondary | ICD-10-CM | POA: Diagnosis not present

## 2018-07-20 DIAGNOSIS — R319 Hematuria, unspecified: Secondary | ICD-10-CM | POA: Diagnosis not present

## 2018-07-30 DIAGNOSIS — F331 Major depressive disorder, recurrent, moderate: Secondary | ICD-10-CM | POA: Diagnosis not present

## 2018-07-30 DIAGNOSIS — F411 Generalized anxiety disorder: Secondary | ICD-10-CM | POA: Diagnosis not present

## 2018-08-03 DIAGNOSIS — N390 Urinary tract infection, site not specified: Secondary | ICD-10-CM | POA: Diagnosis not present

## 2018-08-03 DIAGNOSIS — R319 Hematuria, unspecified: Secondary | ICD-10-CM | POA: Diagnosis not present

## 2018-08-05 ENCOUNTER — Ambulatory Visit (INDEPENDENT_AMBULATORY_CARE_PROVIDER_SITE_OTHER): Payer: Medicare Other

## 2018-08-05 ENCOUNTER — Encounter: Payer: Self-pay | Admitting: Sports Medicine

## 2018-08-05 ENCOUNTER — Ambulatory Visit (INDEPENDENT_AMBULATORY_CARE_PROVIDER_SITE_OTHER): Payer: Medicare Other | Admitting: Sports Medicine

## 2018-08-05 DIAGNOSIS — M25551 Pain in right hip: Secondary | ICD-10-CM

## 2018-08-05 DIAGNOSIS — M7918 Myalgia, other site: Secondary | ICD-10-CM

## 2018-08-05 DIAGNOSIS — M5136 Other intervertebral disc degeneration, lumbar region: Secondary | ICD-10-CM | POA: Diagnosis not present

## 2018-08-05 DIAGNOSIS — S79911A Unspecified injury of right hip, initial encounter: Secondary | ICD-10-CM | POA: Diagnosis not present

## 2018-08-05 DIAGNOSIS — M47816 Spondylosis without myelopathy or radiculopathy, lumbar region: Secondary | ICD-10-CM | POA: Diagnosis not present

## 2018-08-05 MED ORDER — TRAMADOL HCL 50 MG PO TABS: 50.0000 mg | ORAL_TABLET | Freq: Three times a day (TID) | ORAL | 0 refills | Status: DC | PRN

## 2018-08-05 NOTE — Progress Notes (Signed)
Subjective:    CC: Recent fall  HPI: This is a pleasant 84 year old female, a couple weeks ago she fell onto her right buttock, she is had pain since then deep in the buttock, lateral to the sacrum around the ischial spine.  Moderate, persistent without radiation down the leg, no back pain, no bowel or bladder dysfunction.  She is ambulatory.  I reviewed the past medical history, family history, social history, surgical history, and allergies today and no changes were needed.  Please see the problem list section below in epic for further details.  Past Medical History: Past Medical History:  Diagnosis Date  . Anemia    low iron  . Anxiety   . Arthritis   . Depression   . Diverticulitis   . Frequent urinary tract infections   . GERD (gastroesophageal reflux disease)   . Hypertension   . Mitral regurgitation   . Osteoporosis   . PAF (paroxysmal atrial fibrillation) ([place])    03/2017 admitted to [facility] with afib with RVR-->SR, discharged on diltiazem (had drop in H/H on anticoag, so d/c'd)  . PONV (postoperative nausea and vomiting)    Past Surgical History: Past Surgical History:  Procedure Laterality Date  . ABDOMINAL HYSTERECTOMY    . APPENDECTOMY    . CHOLECYSTECTOMY    . COLONOSCOPY    . HERNIA REPAIR     abdominal hernia  . KNEE ARTHROSCOPY W/ MENISCAL REPAIR Right   . KYPHOPLASTY    . LUMBAR LAMINECTOMY/DECOMPRESSION MICRODISCECTOMY Left 04/09/2018   Procedure: LAMINECTOMY FOR FACET/SYNOVIAL CYST LUMBAR FOUR   - LUMBAR FIVE , LUMBAR FIVE  - SACRAL ONE  LEFT;  Surgeon: Ashok Pall, MD;  Location: [place];  Service: Neurosurgery;  Laterality: Left;   Social History: Social History   Socioeconomic History  . Marital status: Widowed    Spouse name: Not on file  . Number of children: Not on file  . Years of education: Not on file  . Highest education level: Not on file  Occupational History  . Not on file  Social Needs  . Financial resource strain: Not on file    . Food insecurity:    Worry: Not on file    Inability: Not on file  . Transportation needs:    Medical: Not on file    Non-medical: Not on file  Tobacco Use  . Smoking status: Former Research scientist (life sciences)  . Smokeless tobacco: Never Used  Substance and Sexual Activity  . Alcohol use: No  . Drug use: No  . Sexual activity: Never  Lifestyle  . Physical activity:    Days per week: Not on file    Minutes per session: Not on file  . Stress: Not on file  Relationships  . Social connections:    Talks on phone: Not on file    Gets together: Not on file    Attends religious service: Not on file    Active member of club or organization: Not on file    Attends meetings of clubs or organizations: Not on file    Relationship status: Not on file  Other Topics Concern  . Not on file  Social History Narrative  . Not on file   Family History: No family history on file. Allergies: No Known Allergies Medications: See med rec.  Review of Systems: No fevers, chills, night sweats, weight loss, chest pain, or shortness of breath.   Objective:    General: Well Developed, well nourished, and in no acute distress.  Neuro: Alert and oriented x3, extra-ocular muscles intact, sensation grossly intact.  HEENT: Normocephalic, atraumatic, pupils equal round reactive to light, neck supple, no masses, no lymphadenopathy, thyroid nonpalpable.  Skin: Warm and dry, no rashes. Cardiac: Regular rate and rhythm, no murmurs rubs or gallops, no lower extremity edema.  Respiratory: Clear to auscultation bilaterally. Not using accessory muscles, speaking in full sentences. Right hip: Tender to palpation over the ischial spine as well as the ischial ramus.  Impression and Recommendations:    Right buttock pain Occurred after a fall, pain over the lateral sacrum on the right side/ischial ramus. X-rays of the lumbar spine, as well as the right hip and pelvis. Tramadol for pain. Pelvic CT if persistent  pain. ___________________________________________ Gwen Her. Dianah Field, M.D., ABFM., CAQSM. Primary Care and [place] Instructor of [place] of [facility] of Medicine

## 2018-08-05 NOTE — Assessment & Plan Note (Addendum)
Occurred after a fall, pain over the lateral sacrum on the right side/ischial ramus. X-rays of the lumbar spine, as well as the right hip and pelvis. Tramadol for pain. Pelvic CT if persistent pain.

## 2018-08-13 ENCOUNTER — Encounter: Payer: Self-pay | Admitting: Sports Medicine

## 2018-08-13 ENCOUNTER — Ambulatory Visit (INDEPENDENT_AMBULATORY_CARE_PROVIDER_SITE_OTHER): Payer: Medicare Other | Admitting: Sports Medicine

## 2018-08-13 ENCOUNTER — Ambulatory Visit (INDEPENDENT_AMBULATORY_CARE_PROVIDER_SITE_OTHER): Payer: Medicare Other

## 2018-08-13 VITALS — BP 161/80 | HR 91 | Ht 61.0 in | Wt 149.0 lb

## 2018-08-13 DIAGNOSIS — N390 Urinary tract infection, site not specified: Secondary | ICD-10-CM | POA: Insufficient documentation

## 2018-08-13 DIAGNOSIS — M7918 Myalgia, other site: Secondary | ICD-10-CM

## 2018-08-13 DIAGNOSIS — M533 Sacrococcygeal disorders, not elsewhere classified: Secondary | ICD-10-CM | POA: Diagnosis not present

## 2018-08-13 DIAGNOSIS — R300 Dysuria: Secondary | ICD-10-CM

## 2018-08-13 DIAGNOSIS — I700 Atherosclerosis of aorta: Secondary | ICD-10-CM | POA: Diagnosis not present

## 2018-08-13 DIAGNOSIS — S329XXA Fracture of unspecified parts of lumbosacral spine and pelvis, initial encounter for closed fracture: Secondary | ICD-10-CM

## 2018-08-13 DIAGNOSIS — K5730 Diverticulosis of large intestine without perforation or abscess without bleeding: Secondary | ICD-10-CM

## 2018-08-13 DIAGNOSIS — F331 Major depressive disorder, recurrent, moderate: Secondary | ICD-10-CM | POA: Diagnosis not present

## 2018-08-13 DIAGNOSIS — F411 Generalized anxiety disorder: Secondary | ICD-10-CM | POA: Diagnosis not present

## 2018-08-13 LAB — POCT URINALYSIS DIPSTICK
Bilirubin, UA: NEGATIVE
Glucose, UA: NEGATIVE
Ketones, UA: NEGATIVE
Nitrite, UA: NEGATIVE
Protein, UA: POSITIVE — AB
Spec Grav, UA: >=1.03 — AB (ref 1.010–1.025)
Urobilinogen, UA: 1 U/dL
pH, UA: 7 (ref 5.0–8.0)

## 2018-08-13 MED ORDER — TRAMADOL HCL 50 MG PO TABS: 50.0000 mg | ORAL_TABLET | Freq: Four times a day (QID) | ORAL | 0 refills | Status: DC | PRN

## 2018-08-13 MED ORDER — NITROFURANTOIN MONOHYD MACRO 100 MG PO CAPS: 100.0000 mg | ORAL_CAPSULE | Freq: Two times a day (BID) | ORAL | 0 refills | Status: DC

## 2018-08-13 NOTE — Assessment & Plan Note (Signed)
UTI symptoms, blood, leukocytes on urinalysis. Adding a urine culture and initiating treatment with nitrofurantoin.

## 2018-08-13 NOTE — Assessment & Plan Note (Signed)
Pain over the lateral sacrum, as well as the right side of the ischial ramus. X-rays of the lumbar spine, hip, pelvis were negative. Tramadol's not sufficiently controlling pain, adding a pelvic CT without contrast for further evaluation.

## 2018-08-13 NOTE — Progress Notes (Signed)
Subjective:    CC: Follow-up  HPI: Right buttock pain: Present after a fall, x-rays were negative but persistent pain.  No bowel or bladder dysfunction, saddle numbness, constitutional symptoms, no progressive weakness.  Dysuria: Typically sees her urologist, having increasing pain, burning, dysuria without flank pain, fevers, chills.  I reviewed the past medical history, family history, social history, surgical history, and allergies today and no changes were needed.  Please see the problem list section below in epic for further details.  Past Medical History: Past Medical History:  Diagnosis Date  . Anemia    low iron  . Anxiety   . Arthritis   . Depression   . Diverticulitis   . Frequent urinary tract infections   . GERD (gastroesophageal reflux disease)   . Hypertension   . Mitral regurgitation   . Osteoporosis   . PAF (paroxysmal atrial fibrillation) ([place])    03/2017 admitted to [facility] with afib with RVR-->SR, discharged on diltiazem (had drop in H/H on anticoag, so d/c'd)  . PONV (postoperative nausea and vomiting)    Past Surgical History: Past Surgical History:  Procedure Laterality Date  . ABDOMINAL HYSTERECTOMY    . APPENDECTOMY    . CHOLECYSTECTOMY    . COLONOSCOPY    . HERNIA REPAIR     abdominal hernia  . KNEE ARTHROSCOPY W/ MENISCAL REPAIR Right   . KYPHOPLASTY    . LUMBAR LAMINECTOMY/DECOMPRESSION MICRODISCECTOMY Left 04/09/2018   Procedure: LAMINECTOMY FOR FACET/SYNOVIAL CYST LUMBAR FOUR   - LUMBAR FIVE , LUMBAR FIVE  - SACRAL ONE  LEFT;  Surgeon: Ashok Pall, MD;  Location: [place];  Service: Neurosurgery;  Laterality: Left;   Social History: Social History   Socioeconomic History  . Marital status: Widowed    Spouse name: Not on file  . Number of children: Not on file  . Years of education: Not on file  . Highest education level: Not on file  Occupational History  . Not on file  Social Needs  . Financial resource strain: Not on file  .  Food insecurity:    Worry: Not on file    Inability: Not on file  . Transportation needs:    Medical: Not on file    Non-medical: Not on file  Tobacco Use  . Smoking status: Former Research scientist (life sciences)  . Smokeless tobacco: Never Used  Substance and Sexual Activity  . Alcohol use: No  . Drug use: No  . Sexual activity: Never  Lifestyle  . Physical activity:    Days per week: Not on file    Minutes per session: Not on file  . Stress: Not on file  Relationships  . Social connections:    Talks on phone: Not on file    Gets together: Not on file    Attends religious service: Not on file    Active member of club or organization: Not on file    Attends meetings of clubs or organizations: Not on file    Relationship status: Not on file  Other Topics Concern  . Not on file  Social History Narrative  . Not on file   Family History: No family history on file. Allergies: No Known Allergies Medications: See med rec.  Review of Systems: No fevers, chills, night sweats, weight loss, chest pain, or shortness of breath.   Objective:    General: Well Developed, well nourished, and in no acute distress.  Neuro: Alert and oriented x3, extra-ocular muscles intact, sensation grossly intact.  HEENT: Normocephalic,  atraumatic, pupils equal round reactive to light, neck supple, no masses, no lymphadenopathy, thyroid nonpalpable.  Skin: Warm and dry, no rashes. Cardiac: Regular rate and rhythm, no murmurs rubs or gallops, no lower extremity edema.  Respiratory: Clear to auscultation bilaterally. Not using accessory muscles, speaking in full sentences.  Urinalysis is positive for blood and leukocytes, sent for culture.  CT pelvis personally reviewed, no evidence of fracture through the pelvis or femur.  Impression and Recommendations:    Right buttock pain Pain over the lateral sacrum, as well as the right side of the ischial ramus. X-rays of the lumbar spine, hip, pelvis were negative. Tramadol's  not sufficiently controlling pain, adding a pelvic CT without contrast for further evaluation.  Dysuria UTI symptoms, blood, leukocytes on urinalysis. Adding a urine culture and initiating treatment with nitrofurantoin.  I spent 25 minutes with this patient, greater than 50% was face-to-face time counseling regarding the above diagnoses ___________________________________________ Gwen Her. Dianah Field, M.D., ABFM., CAQSM. Primary Care and [place] Instructor of [place] of [facility] of Medicine

## 2018-08-14 LAB — URINE CULTURE
MICRO NUMBER:: 90971644
SPECIMEN QUALITY:: ADEQUATE

## 2018-08-27 DIAGNOSIS — F331 Major depressive disorder, recurrent, moderate: Secondary | ICD-10-CM | POA: Diagnosis not present

## 2018-08-27 DIAGNOSIS — F411 Generalized anxiety disorder: Secondary | ICD-10-CM | POA: Diagnosis not present

## 2018-09-02 ENCOUNTER — Telehealth: Payer: Self-pay | Admitting: Sports Medicine

## 2018-09-02 NOTE — Telephone Encounter (Signed)
I am not her PCP.  I treated her as a favor last time but she needs to discuss this with her medical provider and/or urologist.  She should get an appointment tomorrow with one of them.

## 2018-09-02 NOTE — Telephone Encounter (Signed)
PT stated urinary issues are still persisting even after medication.  Please advise.  08/13/18 Appointment

## 2018-09-03 ENCOUNTER — Encounter: Payer: Self-pay | Admitting: Sports Medicine

## 2018-09-03 ENCOUNTER — Ambulatory Visit (INDEPENDENT_AMBULATORY_CARE_PROVIDER_SITE_OTHER): Payer: Medicare Other | Admitting: Sports Medicine

## 2018-09-03 VITALS — BP 151/79 | HR 90 | Temp 97.5°F

## 2018-09-03 DIAGNOSIS — Z87440 Personal history of urinary (tract) infections: Secondary | ICD-10-CM

## 2018-09-03 DIAGNOSIS — F411 Generalized anxiety disorder: Secondary | ICD-10-CM | POA: Diagnosis not present

## 2018-09-03 DIAGNOSIS — R300 Dysuria: Secondary | ICD-10-CM

## 2018-09-03 DIAGNOSIS — F331 Major depressive disorder, recurrent, moderate: Secondary | ICD-10-CM | POA: Diagnosis not present

## 2018-09-03 DIAGNOSIS — N390 Urinary tract infection, site not specified: Secondary | ICD-10-CM | POA: Diagnosis not present

## 2018-09-03 LAB — POCT URINALYSIS DIPSTICK
Bilirubin, UA: NEGATIVE
Glucose, UA: POSITIVE — AB
Ketones, UA: NEGATIVE
Nitrite, UA: POSITIVE
Protein, UA: POSITIVE — AB
Spec Grav, UA: 1.015 (ref 1.010–1.025)
Urobilinogen, UA: 1 U/dL
pH, UA: 6 (ref 5.0–8.0)

## 2018-09-03 MED ORDER — CEPHALEXIN 500 MG PO CAPS: 500.0000 mg | ORAL_CAPSULE | Freq: Two times a day (BID) | ORAL | 0 refills | Status: DC

## 2018-09-03 MED ORDER — CEPHALEXIN 250 MG PO CAPS
250.0000 mg | ORAL_CAPSULE | Freq: Every day | ORAL | 0 refills | Status: DC
Start: 2018-09-03 — End: 2019-01-11

## 2018-09-03 NOTE — Progress Notes (Addendum)
Subjective:    CC: Dysuria  HPI: This is a pleasant 84 year old female with frequent UTIs, she is had 3 in the past month or 2, and countless UTIs through the year.  Tells me she has been on Bactrim prophylaxis.  Now having recurrence of urgency, frequency, burning.  Has had some episodes of constipation which she thinks are contributory and she is treating aggressively.  No fevers, chills, night sweats, weight loss.  No vaginal discharge, no flank pain.  Symptoms are moderate, recurrent, persistent.  I reviewed the past medical history, family history, social history, surgical history, and allergies today and no changes were needed.  Please see the problem list section below in epic for further details.  Past Medical History: Past Medical History:  Diagnosis Date  . Anemia    low iron  . Anxiety   . Arthritis   . Depression   . Diverticulitis   . Frequent urinary tract infections   . GERD (gastroesophageal reflux disease)   . Hypertension   . Mitral regurgitation   . Osteoporosis   . PAF (paroxysmal atrial fibrillation) ([place])    03/2017 admitted to [facility] with afib with RVR-->SR, discharged on diltiazem (had drop in H/H on anticoag, so d/c'd)  . PONV (postoperative nausea and vomiting)    Past Surgical History: Past Surgical History:  Procedure Laterality Date  . ABDOMINAL HYSTERECTOMY    . APPENDECTOMY    . CHOLECYSTECTOMY    . COLONOSCOPY    . HERNIA REPAIR     abdominal hernia  . KNEE ARTHROSCOPY W/ MENISCAL REPAIR Right   . KYPHOPLASTY    . LUMBAR LAMINECTOMY/DECOMPRESSION MICRODISCECTOMY Left 04/09/2018   Procedure: LAMINECTOMY FOR FACET/SYNOVIAL CYST LUMBAR FOUR   - LUMBAR FIVE , LUMBAR FIVE  - SACRAL ONE  LEFT;  Surgeon: Ashok Pall, MD;  Location: [place];  Service: Neurosurgery;  Laterality: Left;   Social History: Social History   Socioeconomic History  . Marital status: Widowed    Spouse name: Not on file  . Number of children: Not on file  . Years of  education: Not on file  . Highest education level: Not on file  Occupational History  . Not on file  Social Needs  . Financial resource strain: Not on file  . Food insecurity:    Worry: Not on file    Inability: Not on file  . Transportation needs:    Medical: Not on file    Non-medical: Not on file  Tobacco Use  . Smoking status: Former Research scientist (life sciences)  . Smokeless tobacco: Never Used  Substance and Sexual Activity  . Alcohol use: No  . Drug use: No  . Sexual activity: Never  Lifestyle  . Physical activity:    Days per week: Not on file    Minutes per session: Not on file  . Stress: Not on file  Relationships  . Social connections:    Talks on phone: Not on file    Gets together: Not on file    Attends religious service: Not on file    Active member of club or organization: Not on file    Attends meetings of clubs or organizations: Not on file    Relationship status: Not on file  Other Topics Concern  . Not on file  Social History Narrative  . Not on file   Family History: No family history on file. Allergies: No Known Allergies Medications: See med rec.  Review of Systems: No fevers, chills, night sweats, weight  loss, chest pain, or shortness of breath.   Objective:    General: Well Developed, well nourished, and in no acute distress.  Neuro: Alert and oriented x3, extra-ocular muscles intact, sensation grossly intact.  HEENT: Normocephalic, atraumatic, pupils equal round reactive to light, neck supple, no masses, no lymphadenopathy, thyroid nonpalpable.  Skin: Warm and dry, no rashes. Cardiac: Regular rate and rhythm, no murmurs rubs or gallops, no lower extremity edema.  Respiratory: Clear to auscultation bilaterally. Not using accessory muscles, speaking in full sentences. Abdomen: Soft, tender to palpation in the suprapubic region, nontender, nondistended, normal bowel sounds, no palpable masses, no guarding, rigidity, rebound tenderness, no costovertebral angle  pain.  Urinalysis is positive for blood, leukocytes, nitrites.  Impression and Recommendations:    Recurrent urinary tract infection Several times per year and at least 3 times in the past couple of months. Multiple bacterial morphotypes grown in the last culture. Discontinue Bactrim prophylaxis, we used nitrofurantoin last time, full dose Keflex for 7 days this time followed by Keflex 250 daily. She does need to keep close follow-up with her PCP and her urologist.   I did remind her that this was a sports medicine practice.  I spent 25 minutes with this patient, greater than 50% was face-to-face time counseling regarding the above diagnoses ___________________________________________ Gwen Her. Dianah Field, M.D., ABFM., CAQSM. Primary Care and [place] Instructor of [place] of [facility] of Medicine

## 2018-09-03 NOTE — Assessment & Plan Note (Signed)
Several times per year and at least 3 times in the past couple of months. Multiple bacterial morphotypes grown in the last culture. Discontinue Bactrim prophylaxis, we used nitrofurantoin last time, full dose Keflex for 7 days this time followed by Keflex 250 daily. She does need to keep close follow-up with her PCP and her urologist.   I did remind her that this was a sports medicine practice.

## 2018-09-03 NOTE — Telephone Encounter (Signed)
Left VM for Pt with recommendation  

## 2018-09-04 DIAGNOSIS — L97521 Non-pressure chronic ulcer of other part of left foot limited to breakdown of skin: Secondary | ICD-10-CM | POA: Diagnosis not present

## 2018-09-05 LAB — URINE CULTURE
MICRO NUMBER:: 91064892
SPECIMEN QUALITY:: ADEQUATE

## 2018-09-08 ENCOUNTER — Other Ambulatory Visit (HOSPITAL_BASED_OUTPATIENT_CLINIC_OR_DEPARTMENT_OTHER): Payer: Self-pay | Admitting: Neurosurgery

## 2018-09-08 DIAGNOSIS — M48062 Spinal stenosis, lumbar region with neurogenic claudication: Secondary | ICD-10-CM | POA: Diagnosis not present

## 2018-09-08 DIAGNOSIS — I10 Essential (primary) hypertension: Secondary | ICD-10-CM | POA: Diagnosis not present

## 2018-09-08 DIAGNOSIS — Z6825 Body mass index (BMI) 25.0-25.9, adult: Secondary | ICD-10-CM | POA: Diagnosis not present

## 2018-09-10 DIAGNOSIS — F331 Major depressive disorder, recurrent, moderate: Secondary | ICD-10-CM | POA: Diagnosis not present

## 2018-09-10 DIAGNOSIS — F411 Generalized anxiety disorder: Secondary | ICD-10-CM | POA: Diagnosis not present

## 2018-09-14 DIAGNOSIS — L97511 Non-pressure chronic ulcer of other part of right foot limited to breakdown of skin: Secondary | ICD-10-CM | POA: Diagnosis not present

## 2018-09-17 ENCOUNTER — Other Ambulatory Visit: Payer: Self-pay | Admitting: Sports Medicine

## 2018-09-17 DIAGNOSIS — M7918 Myalgia, other site: Secondary | ICD-10-CM

## 2018-09-22 DIAGNOSIS — N390 Urinary tract infection, site not specified: Secondary | ICD-10-CM | POA: Diagnosis not present

## 2018-09-22 DIAGNOSIS — N3941 Urge incontinence: Secondary | ICD-10-CM | POA: Diagnosis not present

## 2018-09-22 DIAGNOSIS — R319 Hematuria, unspecified: Secondary | ICD-10-CM | POA: Diagnosis not present

## 2018-09-24 DIAGNOSIS — F331 Major depressive disorder, recurrent, moderate: Secondary | ICD-10-CM | POA: Diagnosis not present

## 2018-09-24 DIAGNOSIS — F411 Generalized anxiety disorder: Secondary | ICD-10-CM | POA: Diagnosis not present

## 2018-09-24 DIAGNOSIS — M48062 Spinal stenosis, lumbar region with neurogenic claudication: Secondary | ICD-10-CM | POA: Diagnosis not present

## 2018-09-26 ENCOUNTER — Ambulatory Visit (HOSPITAL_BASED_OUTPATIENT_CLINIC_OR_DEPARTMENT_OTHER)
Admission: RE | Admit: 2018-09-26 | Discharge: 2018-09-26 | Disposition: A | Discharge status: Home / Self Care | Payer: Medicare Other | Source: Ambulatory Visit | Attending: Neurosurgery | Admitting: Neurosurgery

## 2018-09-26 DIAGNOSIS — M48062 Spinal stenosis, lumbar region with neurogenic claudication: Secondary | ICD-10-CM | POA: Insufficient documentation

## 2018-09-26 DIAGNOSIS — M961 Postlaminectomy syndrome, not elsewhere classified: Secondary | ICD-10-CM | POA: Diagnosis not present

## 2018-09-26 DIAGNOSIS — M48061 Spinal stenosis, lumbar region without neurogenic claudication: Secondary | ICD-10-CM | POA: Diagnosis not present

## 2018-09-26 DIAGNOSIS — M5136 Other intervertebral disc degeneration, lumbar region: Secondary | ICD-10-CM | POA: Diagnosis not present

## 2018-09-26 MED ORDER — GADOBENATE DIMEGLUMINE 529 MG/ML IV SOLN
15.0000 mL | Freq: Once | INTRAVENOUS | Status: AC | PRN
  Administered 2018-09-26: 12 mL via INTRAVENOUS

## 2018-10-01 DIAGNOSIS — F411 Generalized anxiety disorder: Secondary | ICD-10-CM | POA: Diagnosis not present

## 2018-10-01 DIAGNOSIS — F331 Major depressive disorder, recurrent, moderate: Secondary | ICD-10-CM | POA: Diagnosis not present

## 2018-10-05 DIAGNOSIS — N390 Urinary tract infection, site not specified: Secondary | ICD-10-CM | POA: Diagnosis not present

## 2018-10-08 DIAGNOSIS — F331 Major depressive disorder, recurrent, moderate: Secondary | ICD-10-CM | POA: Diagnosis not present

## 2018-10-08 DIAGNOSIS — F411 Generalized anxiety disorder: Secondary | ICD-10-CM | POA: Diagnosis not present

## 2018-10-12 DIAGNOSIS — M48062 Spinal stenosis, lumbar region with neurogenic claudication: Secondary | ICD-10-CM | POA: Diagnosis not present

## 2018-10-19 DIAGNOSIS — M961 Postlaminectomy syndrome, not elsewhere classified: Secondary | ICD-10-CM | POA: Diagnosis not present

## 2018-10-19 DIAGNOSIS — M5416 Radiculopathy, lumbar region: Secondary | ICD-10-CM | POA: Diagnosis not present

## 2018-10-19 DIAGNOSIS — M48062 Spinal stenosis, lumbar region with neurogenic claudication: Secondary | ICD-10-CM | POA: Diagnosis not present

## 2018-10-20 DIAGNOSIS — M48062 Spinal stenosis, lumbar region with neurogenic claudication: Secondary | ICD-10-CM | POA: Diagnosis not present

## 2018-10-20 DIAGNOSIS — M5416 Radiculopathy, lumbar region: Secondary | ICD-10-CM | POA: Diagnosis not present

## 2018-10-22 DIAGNOSIS — F411 Generalized anxiety disorder: Secondary | ICD-10-CM | POA: Diagnosis not present

## 2018-10-22 DIAGNOSIS — F331 Major depressive disorder, recurrent, moderate: Secondary | ICD-10-CM | POA: Diagnosis not present

## 2018-11-02 DIAGNOSIS — R319 Hematuria, unspecified: Secondary | ICD-10-CM | POA: Diagnosis not present

## 2018-11-02 DIAGNOSIS — N390 Urinary tract infection, site not specified: Secondary | ICD-10-CM | POA: Diagnosis not present

## 2018-11-02 DIAGNOSIS — N3941 Urge incontinence: Secondary | ICD-10-CM | POA: Diagnosis not present

## 2018-11-03 DIAGNOSIS — I480 Paroxysmal atrial fibrillation: Secondary | ICD-10-CM | POA: Diagnosis not present

## 2018-11-05 DIAGNOSIS — F411 Generalized anxiety disorder: Secondary | ICD-10-CM | POA: Diagnosis not present

## 2018-11-05 DIAGNOSIS — F331 Major depressive disorder, recurrent, moderate: Secondary | ICD-10-CM | POA: Diagnosis not present

## 2018-11-09 ENCOUNTER — Other Ambulatory Visit: Payer: Self-pay | Admitting: Sports Medicine

## 2018-11-09 DIAGNOSIS — M48061 Spinal stenosis, lumbar region without neurogenic claudication: Secondary | ICD-10-CM

## 2018-11-17 DIAGNOSIS — M48062 Spinal stenosis, lumbar region with neurogenic claudication: Secondary | ICD-10-CM | POA: Diagnosis not present

## 2018-11-23 DIAGNOSIS — F331 Major depressive disorder, recurrent, moderate: Secondary | ICD-10-CM | POA: Diagnosis not present

## 2018-11-23 DIAGNOSIS — F411 Generalized anxiety disorder: Secondary | ICD-10-CM | POA: Diagnosis not present

## 2018-12-03 DIAGNOSIS — F331 Major depressive disorder, recurrent, moderate: Secondary | ICD-10-CM | POA: Diagnosis not present

## 2018-12-03 DIAGNOSIS — F411 Generalized anxiety disorder: Secondary | ICD-10-CM | POA: Diagnosis not present

## 2018-12-10 DIAGNOSIS — Z23 Encounter for immunization: Secondary | ICD-10-CM | POA: Diagnosis not present

## 2018-12-10 DIAGNOSIS — F411 Generalized anxiety disorder: Secondary | ICD-10-CM | POA: Diagnosis not present

## 2018-12-10 DIAGNOSIS — F331 Major depressive disorder, recurrent, moderate: Secondary | ICD-10-CM | POA: Diagnosis not present

## 2018-12-11 DIAGNOSIS — Z6825 Body mass index (BMI) 25.0-25.9, adult: Secondary | ICD-10-CM | POA: Diagnosis not present

## 2018-12-11 DIAGNOSIS — M5416 Radiculopathy, lumbar region: Secondary | ICD-10-CM | POA: Diagnosis not present

## 2018-12-11 DIAGNOSIS — I10 Essential (primary) hypertension: Secondary | ICD-10-CM | POA: Diagnosis not present

## 2018-12-17 DIAGNOSIS — F331 Major depressive disorder, recurrent, moderate: Secondary | ICD-10-CM | POA: Diagnosis not present

## 2018-12-17 DIAGNOSIS — F411 Generalized anxiety disorder: Secondary | ICD-10-CM | POA: Diagnosis not present

## 2018-12-31 DIAGNOSIS — F331 Major depressive disorder, recurrent, moderate: Secondary | ICD-10-CM | POA: Diagnosis not present

## 2018-12-31 DIAGNOSIS — F411 Generalized anxiety disorder: Secondary | ICD-10-CM | POA: Diagnosis not present

## 2019-01-07 DIAGNOSIS — F331 Major depressive disorder, recurrent, moderate: Secondary | ICD-10-CM | POA: Diagnosis not present

## 2019-01-07 DIAGNOSIS — F411 Generalized anxiety disorder: Secondary | ICD-10-CM | POA: Diagnosis not present

## 2019-01-11 ENCOUNTER — Ambulatory Visit (INDEPENDENT_AMBULATORY_CARE_PROVIDER_SITE_OTHER): Payer: Medicare Other | Admitting: Sports Medicine

## 2019-01-11 ENCOUNTER — Encounter: Payer: Self-pay | Admitting: Sports Medicine

## 2019-01-11 DIAGNOSIS — M48061 Spinal stenosis, lumbar region without neurogenic claudication: Secondary | ICD-10-CM

## 2019-01-11 MED ORDER — GABAPENTIN 300 MG PO CAPS: ORAL_CAPSULE | ORAL | 3 refills | Status: DC

## 2019-01-11 MED ORDER — PREDNISONE 50 MG PO TABS: ORAL_TABLET | ORAL | 0 refills | Status: DC

## 2019-01-11 NOTE — Assessment & Plan Note (Addendum)
Is now post multilevel lumbar laminectomy. Persistent and severe pain in the right side of the low back, as well as on the right ischial ramus. Because of her previous surgery we do need another lumbar spine MRI, I do want a pelvic MRI as well looking for an ischial stress injury. Adding 5 days of prednisone, and increasing gabapentin from 100 mg twice daily to 300 mg twice daily to 3 times daily. Return to see me to go over MRI results. They did see Dr. Christella Noa, he told them that there is nothing else surgical to offer.

## 2019-01-11 NOTE — Progress Notes (Signed)
Subjective:    CC: Low back pain  HPI: Deborah Robbins is a pleasant 85 year old female, she has a history of multilevel lumbar stenosis with multifactorial stenosis at multiple levels.  She is post L4-L5 and L5-S1 laminectomy with facetectomy in April.  She overall did well, this was done for mostly left-sided axial pain.  She had a left-sided synovial cyst at L5-S1.  Unfortunately she is now developing a recurrence of pain, this time on the right, deep in the right buttock, nothing overtly radicular but she does have some persistent bilateral lower extremity numbness since before her surgery.  No bowel or bladder dysfunction, no saddle numbness, no constitutional symptoms.  I reviewed the past medical history, family history, social history, surgical history, and allergies today and no changes were needed.  Please see the problem list section below in epic for further details.  Past Medical History: Past Medical History:  Diagnosis Date  . Anemia    low iron  . Anxiety   . Arthritis   . Depression   . Diverticulitis   . Frequent urinary tract infections   . GERD (gastroesophageal reflux disease)   . Hypertension   . Mitral regurgitation   . Osteoporosis   . PAF (paroxysmal atrial fibrillation) ([place])    03/2017 admitted to [facility] with afib with RVR-->SR, discharged on diltiazem (had drop in H/H on anticoag, so d/c'd)  . PONV (postoperative nausea and vomiting)    Past Surgical History: Past Surgical History:  Procedure Laterality Date  . ABDOMINAL HYSTERECTOMY    . APPENDECTOMY    . CHOLECYSTECTOMY    . COLONOSCOPY    . HERNIA REPAIR     abdominal hernia  . KNEE ARTHROSCOPY W/ MENISCAL REPAIR Right   . KYPHOPLASTY    . LUMBAR LAMINECTOMY/DECOMPRESSION MICRODISCECTOMY Left 04/09/2018   Procedure: LAMINECTOMY FOR FACET/SYNOVIAL CYST LUMBAR FOUR   - LUMBAR FIVE , LUMBAR FIVE  - SACRAL ONE  LEFT;  Surgeon: Ashok Pall, MD;  Location: [place];  Service: Neurosurgery;  Laterality: Left;     Social History: Social History   Socioeconomic History  . Marital status: Widowed    Spouse name: Not on file  . Number of children: Not on file  . Years of education: Not on file  . Highest education level: Not on file  Occupational History  . Not on file  Social Needs  . Financial resource strain: Not on file  . Food insecurity:    Worry: Not on file    Inability: Not on file  . Transportation needs:    Medical: Not on file    Non-medical: Not on file  Tobacco Use  . Smoking status: Former Research scientist (life sciences)  . Smokeless tobacco: Never Used  Substance and Sexual Activity  . Alcohol use: No  . Drug use: No  . Sexual activity: Never  Lifestyle  . Physical activity:    Days per week: Not on file    Minutes per session: Not on file  . Stress: Not on file  Relationships  . Social connections:    Talks on phone: Not on file    Gets together: Not on file    Attends religious service: Not on file    Active member of club or organization: Not on file    Attends meetings of clubs or organizations: Not on file    Relationship status: Not on file  Other Topics Concern  . Not on file  Social History Narrative  . Not on file  Family History: No family history on file. Allergies: No Known Allergies Medications: See med rec.  Review of Systems: No fevers, chills, night sweats, weight loss, chest pain, or shortness of breath.   Objective:    General: Well Developed, well nourished, and in no acute distress.  Neuro: Alert and oriented x3, extra-ocular muscles intact, sensation grossly intact.  HEENT: Normocephalic, atraumatic, pupils equal round reactive to light, neck supple, no masses, no lymphadenopathy, thyroid nonpalpable.  Skin: Warm and dry, no rashes. Cardiac: Regular rate and rhythm, no murmurs rubs or gallops, no lower extremity edema.  Respiratory: Clear to auscultation bilaterally. Not using accessory muscles, speaking in full sentences. Low back: Tender to palpation  deep in the right buttock, just posterior to the ischial tuberosity.  She really does not have much tenderness itself over her lumbar spine, paralumbar musculature or sacroiliac joint.  Impression and Recommendations:    Lumbar spinal stenosis Is now post multilevel lumbar laminectomy. Persistent and severe pain in the right side of the low back, as well as on the right ischial ramus. Because of her previous surgery we do need another lumbar spine MRI, I do want a pelvic MRI as well looking for an ischial stress injury. Adding 5 days of prednisone, and increasing gabapentin from 100 mg twice daily to 300 mg twice daily to 3 times daily. Return to see me to go over MRI results. They did see Dr. Christella Noa, he told them that there is nothing else surgical to offer. ___________________________________________ Gwen Her. Dianah Field, M.D., ABFM., CAQSM. Primary Care and Sports Medicine [facility]  Adjunct Professor of [place] [employer] [facility] [employer]

## 2019-01-14 DIAGNOSIS — F411 Generalized anxiety disorder: Secondary | ICD-10-CM | POA: Diagnosis not present

## 2019-01-14 DIAGNOSIS — F331 Major depressive disorder, recurrent, moderate: Secondary | ICD-10-CM | POA: Diagnosis not present

## 2019-01-18 ENCOUNTER — Ambulatory Visit (INDEPENDENT_AMBULATORY_CARE_PROVIDER_SITE_OTHER): Payer: Medicare Other

## 2019-01-18 DIAGNOSIS — M48061 Spinal stenosis, lumbar region without neurogenic claudication: Secondary | ICD-10-CM | POA: Diagnosis not present

## 2019-01-18 DIAGNOSIS — M5126 Other intervertebral disc displacement, lumbar region: Secondary | ICD-10-CM | POA: Diagnosis not present

## 2019-01-18 DIAGNOSIS — M4807 Spinal stenosis, lumbosacral region: Secondary | ICD-10-CM | POA: Diagnosis not present

## 2019-01-18 DIAGNOSIS — M79605 Pain in left leg: Secondary | ICD-10-CM | POA: Diagnosis not present

## 2019-01-18 DIAGNOSIS — M79604 Pain in right leg: Secondary | ICD-10-CM | POA: Diagnosis not present

## 2019-01-21 DIAGNOSIS — F331 Major depressive disorder, recurrent, moderate: Secondary | ICD-10-CM | POA: Diagnosis not present

## 2019-01-21 DIAGNOSIS — F411 Generalized anxiety disorder: Secondary | ICD-10-CM | POA: Diagnosis not present

## 2019-01-25 ENCOUNTER — Ambulatory Visit (INDEPENDENT_AMBULATORY_CARE_PROVIDER_SITE_OTHER): Payer: Medicare Other | Admitting: Sports Medicine

## 2019-01-25 ENCOUNTER — Encounter: Payer: Self-pay | Admitting: Sports Medicine

## 2019-01-25 DIAGNOSIS — M48061 Spinal stenosis, lumbar region without neurogenic claudication: Secondary | ICD-10-CM | POA: Diagnosis not present

## 2019-01-25 NOTE — Progress Notes (Signed)
Subjective:    CC: MRI results  HPI: This a pleasant 85 year old female, we have been treating her for back pain post surgery, initially we treated her conservatively with therapy, medications, epidurals.  She ultimately ended up with an L4-5 and L5-S1 hemi-semi-laminectomy with Dr. Christella Noa with neurosurgery.  Unfortunately she is continuing to have significant discomfort, axial with radiation into the buttock.  We obtained a new MRI with contrast of the pelvis and lumbar spine for further evaluation.  We also did a burst of prednisone and gabapentin up taper which seems to be working minimally.  No new bowel or bladder dysfunction, saddle numbness, or constitutional symptoms.  I reviewed the past medical history, family history, social history, surgical history, and allergies today and no changes were needed.  Please see the problem list section below in epic for further details.  Past Medical History: Past Medical History:  Diagnosis Date  . Anemia    low iron  . Anxiety   . Arthritis   . Depression   . Diverticulitis   . Frequent urinary tract infections   . GERD (gastroesophageal reflux disease)   . Hypertension   . Mitral regurgitation   . Osteoporosis   . PAF (paroxysmal atrial fibrillation) ([place])    03/2017 admitted to [facility] with afib with RVR-->SR, discharged on diltiazem (had drop in H/H on anticoag, so d/c'd)  . PONV (postoperative nausea and vomiting)    Past Surgical History: Past Surgical History:  Procedure Laterality Date  . ABDOMINAL HYSTERECTOMY    . APPENDECTOMY    . CHOLECYSTECTOMY    . COLONOSCOPY    . HERNIA REPAIR     abdominal hernia  . KNEE ARTHROSCOPY W/ MENISCAL REPAIR Right   . KYPHOPLASTY    . LUMBAR LAMINECTOMY/DECOMPRESSION MICRODISCECTOMY Left 04/09/2018   Procedure: LAMINECTOMY FOR FACET/SYNOVIAL CYST LUMBAR FOUR   - LUMBAR FIVE , LUMBAR FIVE  - SACRAL ONE  LEFT;  Surgeon: Ashok Pall, MD;  Location: [place];  Service: Neurosurgery;   Laterality: Left;   Social History: Social History   Socioeconomic History  . Marital status: Widowed    Spouse name: Not on file  . Number of children: Not on file  . Years of education: Not on file  . Highest education level: Not on file  Occupational History  . Not on file  Social Needs  . Financial resource strain: Not on file  . Food insecurity:    Worry: Not on file    Inability: Not on file  . Transportation needs:    Medical: Not on file    Non-medical: Not on file  Tobacco Use  . Smoking status: Former Research scientist (life sciences)  . Smokeless tobacco: Never Used  Substance and Sexual Activity  . Alcohol use: No  . Drug use: No  . Sexual activity: Never  Lifestyle  . Physical activity:    Days per week: Not on file    Minutes per session: Not on file  . Stress: Not on file  Relationships  . Social connections:    Talks on phone: Not on file    Gets together: Not on file    Attends religious service: Not on file    Active member of club or organization: Not on file    Attends meetings of clubs or organizations: Not on file    Relationship status: Not on file  Other Topics Concern  . Not on file  Social History Narrative  . Not on file   Family History:  No family history on file. Allergies: No Known Allergies Medications: See med rec.  Review of Systems: No fevers, chills, night sweats, weight loss, chest pain, or shortness of breath.   Objective:    General: Well Developed, well nourished, and in no acute distress.  Neuro: Alert and oriented x3, extra-ocular muscles intact, sensation grossly intact.  HEENT: Normocephalic, atraumatic, pupils equal round reactive to light, neck supple, no masses, no lymphadenopathy, thyroid nonpalpable.  Skin: Warm and dry, no rashes. Cardiac: Regular rate and rhythm, no murmurs rubs or gallops, no lower extremity edema.  Respiratory: Clear to auscultation bilaterally. Not using accessory muscles, speaking in full sentences.  L-spine  MRI reviewed shows persistent multilevel spinal stenosis worst at the L4-L5 and L5-S1 levels.  There is a new right-sided L4-L5 synovial cyst causing mass-effect on the descending right-sided nerve roots and thecal sac.  Left-sided L5-S1 facet synovial cyst resulting in foraminal stenosis.  She has multilevel facet arthritis/hypertrophy as well.  Impression and Recommendations:    Lumbar spinal stenosis History of semi-hemilaminectomies, bilateral of L4-L5 and L5-S1 by Dr. Christella Noa. Unfortunately has persistent axial back pain with radiation into the right buttock. Multiple other spondylitic processes including severe facet arthritis. I am going to set her up with a right L5-S1 interlaminar epidural. It does appear as though she still has fairly tight stenosis at both levels. I would like a second opinion from Dr. Phylliss Bob. She is doing her gabapentin up taper, we did a prednisone burst at the last visit. Return to see me 1 month after the injection. ___________________________________________ Gwen Her. Dianah Field, M.D., ABFM., CAQSM. Primary Care and Sports Medicine [facility]  Adjunct Professor of [place] [employer] [facility] of Medicine

## 2019-01-25 NOTE — Assessment & Plan Note (Signed)
History of semi-hemilaminectomies, bilateral of L4-L5 and L5-S1 by Dr. Christella Noa. Unfortunately has persistent axial back pain with radiation into the right buttock. Multiple other spondylitic processes including severe facet arthritis. I am going to set her up with a right L5-S1 interlaminar epidural. It does appear as though she still has fairly tight stenosis at both levels. I would like a second opinion from Dr. Phylliss Bob. She is doing her gabapentin up taper, we did a prednisone burst at the last visit. Return to see me 1 month after the injection.

## 2019-01-26 DIAGNOSIS — M7138 Other bursal cyst, other site: Secondary | ICD-10-CM | POA: Diagnosis not present

## 2019-01-26 DIAGNOSIS — M48062 Spinal stenosis, lumbar region with neurogenic claudication: Secondary | ICD-10-CM | POA: Diagnosis not present

## 2019-01-28 ENCOUNTER — Ambulatory Visit
Admission: RE | Admit: 2019-01-28 | Discharge: 2019-01-28 | Disposition: A | Discharge status: Home / Self Care | Payer: Medicare Other | Source: Ambulatory Visit | Attending: Sports Medicine | Admitting: Sports Medicine

## 2019-01-28 DIAGNOSIS — M47817 Spondylosis without myelopathy or radiculopathy, lumbosacral region: Secondary | ICD-10-CM | POA: Diagnosis not present

## 2019-01-28 MED ORDER — IOPAMIDOL (ISOVUE-M 200) INJECTION 41%
1.0000 mL | Freq: Once | INTRAMUSCULAR | Status: AC
  Administered 2019-01-28: 1 mL via EPIDURAL

## 2019-01-28 MED ORDER — METHYLPREDNISOLONE ACETATE 40 MG/ML INJ SUSP (RADIOLOG
120.0000 mg | Freq: Once | INTRAMUSCULAR | Status: AC
  Administered 2019-01-28: 120 mg via EPIDURAL

## 2019-01-28 NOTE — Discharge Instructions (Signed)

## 2019-02-01 ENCOUNTER — Other Ambulatory Visit: Payer: Self-pay | Admitting: Neurosurgery

## 2019-02-01 DIAGNOSIS — M5416 Radiculopathy, lumbar region: Secondary | ICD-10-CM | POA: Diagnosis not present

## 2019-02-04 DIAGNOSIS — F331 Major depressive disorder, recurrent, moderate: Secondary | ICD-10-CM | POA: Diagnosis not present

## 2019-02-04 DIAGNOSIS — F411 Generalized anxiety disorder: Secondary | ICD-10-CM | POA: Diagnosis not present

## 2019-02-05 DIAGNOSIS — H524 Presbyopia: Secondary | ICD-10-CM | POA: Diagnosis not present

## 2019-02-05 DIAGNOSIS — H353111 Nonexudative age-related macular degeneration, right eye, early dry stage: Secondary | ICD-10-CM | POA: Diagnosis not present

## 2019-02-05 DIAGNOSIS — H5202 Hypermetropia, left eye: Secondary | ICD-10-CM | POA: Diagnosis not present

## 2019-02-05 DIAGNOSIS — H5211 Myopia, right eye: Secondary | ICD-10-CM | POA: Diagnosis not present

## 2019-02-05 DIAGNOSIS — H353122 Nonexudative age-related macular degeneration, left eye, intermediate dry stage: Secondary | ICD-10-CM | POA: Diagnosis not present

## 2019-02-11 DIAGNOSIS — F411 Generalized anxiety disorder: Secondary | ICD-10-CM | POA: Diagnosis not present

## 2019-02-11 DIAGNOSIS — F331 Major depressive disorder, recurrent, moderate: Secondary | ICD-10-CM | POA: Diagnosis not present

## 2019-02-18 ENCOUNTER — Encounter: Payer: Self-pay | Admitting: Family Medicine

## 2019-02-18 ENCOUNTER — Telehealth: Payer: Self-pay | Admitting: Family Medicine

## 2019-02-18 ENCOUNTER — Inpatient Hospital Stay (HOSPITAL_COMMUNITY): Admission: RE | Admit: 2019-02-18 | Payer: Medicare Other | Source: Ambulatory Visit

## 2019-02-18 NOTE — Telephone Encounter (Signed)
Error

## 2019-02-18 NOTE — Telephone Encounter (Signed)
Pt needs to contact Dr. Lacy Duverney office for this matter.  There is nothing Dr. Darene Lamer can do about their scheduling.

## 2019-02-18 NOTE — Telephone Encounter (Signed)
I didn't cancel that appt.  Why not contact........Marland KitchenDr. Alphonzo Dublin office????

## 2019-02-18 NOTE — Telephone Encounter (Signed)
Patients daughter, [name], called in wanting to speak to Dr.Thekkekandam about patient. Patients appointment with Dr.Cabbell was canceled and no one told them. This was for a surgery on mother. Patient states she was told to call back at a later time to push this appointment and is very upset.

## 2019-02-19 NOTE — Telephone Encounter (Signed)
Called and spoke with [place]. Essentially from [name]'s perspective, Dr Christella Noa got upset when they requested a second opinion. Pt was evaluated by Dr Lynann Bologna and was advised to go ahead and proceed with surgery from Dr Christella Noa. When they went to the pre op appointment it was determine that surgery had been cancelled. Per Glass blower/designer with Dr Lacy Duverney office he "had to move it out and if they can't wait then they need a new Doctor." [name] now reports "I don't trust that motherfucker touching my mother, I don't trust him."  Summary of the situation - Dr Lynann Bologna has already done an initial eval. [name] wants Dr T to reach out and see if Dr Lynann Bologna will go ahead and schedule her without having to make a second appointment. Will route.

## 2019-02-22 ENCOUNTER — Ambulatory Visit (INDEPENDENT_AMBULATORY_CARE_PROVIDER_SITE_OTHER): Payer: Medicare Other | Admitting: Sports Medicine

## 2019-02-22 ENCOUNTER — Encounter: Payer: Self-pay | Admitting: Sports Medicine

## 2019-02-22 DIAGNOSIS — M48061 Spinal stenosis, lumbar region without neurogenic claudication: Secondary | ICD-10-CM | POA: Diagnosis not present

## 2019-02-22 MED ORDER — PREDNISONE 20 MG PO TABS: 20.0000 mg | ORAL_TABLET | Freq: Every day | ORAL | 0 refills | Status: DC

## 2019-02-22 NOTE — Progress Notes (Signed)
Subjective:    CC: Back pain  HPI: Deborah Robbins returns, she is an 85 year old female, she has multifactorial spinal stenosis, she is status post bilateral hemilaminectomies from L4-S1.  It seems there is some drama between herself and her neurosurgeon, they had sought a second opinion with orthopedic spine surgery.  I reviewed the past medical history, family history, social history, surgical history, and allergies today and no changes were needed.  Please see the problem list section below in epic for further details.  Past Medical History: Past Medical History:  Diagnosis Date  . Anemia    low iron  . Anxiety   . Arthritis   . Depression   . Diverticulitis   . Frequent urinary tract infections   . GERD (gastroesophageal reflux disease)   . Hypertension   . Mitral regurgitation   . Osteoporosis   . PAF (paroxysmal atrial fibrillation) ([place])    03/2017 admitted to [facility] with afib with RVR-->SR, discharged on diltiazem (had drop in H/H on anticoag, so d/c'd)  . PONV (postoperative nausea and vomiting)    Past Surgical History: Past Surgical History:  Procedure Laterality Date  . ABDOMINAL HYSTERECTOMY    . APPENDECTOMY    . CHOLECYSTECTOMY    . COLONOSCOPY    . HERNIA REPAIR     abdominal hernia  . KNEE ARTHROSCOPY W/ MENISCAL REPAIR Right   . KYPHOPLASTY    . LUMBAR LAMINECTOMY/DECOMPRESSION MICRODISCECTOMY Left 04/09/2018   Procedure: LAMINECTOMY FOR FACET/SYNOVIAL CYST LUMBAR FOUR   - LUMBAR FIVE , LUMBAR FIVE  - SACRAL ONE  LEFT;  Surgeon: Ashok Pall, MD;  Location: [place];  Service: Neurosurgery;  Laterality: Left;   Social History: Social History   Socioeconomic History  . Marital status: Widowed    Spouse name: Not on file  . Number of children: Not on file  . Years of education: Not on file  . Highest education level: Not on file  Occupational History  . Not on file  Social Needs  . Financial resource strain: Not on file  . Food insecurity:    Worry: Not  on file    Inability: Not on file  . Transportation needs:    Medical: Not on file    Non-medical: Not on file  Tobacco Use  . Smoking status: Former Research scientist (life sciences)  . Smokeless tobacco: Never Used  Substance and Sexual Activity  . Alcohol use: No  . Drug use: No  . Sexual activity: Never  Lifestyle  . Physical activity:    Days per week: Not on file    Minutes per session: Not on file  . Stress: Not on file  Relationships  . Social connections:    Talks on phone: Not on file    Gets together: Not on file    Attends religious service: Not on file    Active member of club or organization: Not on file    Attends meetings of clubs or organizations: Not on file    Relationship status: Not on file  Other Topics Concern  . Not on file  Social History Narrative  . Not on file   Family History: No family history on file. Allergies: No Known Allergies Medications: See med rec.  Review of Systems: No fevers, chills, night sweats, weight loss, chest pain, or shortness of breath.   Objective:    General: Well Developed, well nourished, and in no acute distress.  Neuro: Alert and oriented x3, extra-ocular muscles intact, sensation grossly intact.  HEENT:  Normocephalic, atraumatic, pupils equal round reactive to light, neck supple, no masses, no lymphadenopathy, thyroid nonpalpable.  Skin: Warm and dry, no rashes. Cardiac: Regular rate and rhythm, no murmurs rubs or gallops, no lower extremity edema.  Respiratory: Clear to auscultation bilaterally. Not using accessory muscles, speaking in full sentences.  Impression and Recommendations:    Lumbar spinal stenosis History of semi-hemilaminectomies, bilateral from L4-S1 by Dr. Christella Noa. Persistent pain, epidural has not been effective. She has seen Dr. Lynann Bologna. I think they prefer to transfer care to him. It sounds as though her surgery was canceled/postponed by Dr. Christella Noa. I would like him to discuss this with Dr. Christella Noa and Dr.  Laurena Bering offices. I should not be the middleman here. Low-dose prednisone for 5 days per their request. Return to see me as needed.  I spent 25 minutes with this patient, greater than 50% was face-to-face time counseling regarding the above diagnoses, specifically discussing care planning and coordination between her 2 surgeons. ___________________________________________ Gwen Her. Dianah Field, M.D., ABFM., CAQSM. Primary Care and Sports Medicine [facility]  Adjunct Professor of [place] [employer] [facility] of Medicine

## 2019-02-22 NOTE — Assessment & Plan Note (Signed)
History of semi-hemilaminectomies, bilateral from L4-S1 by Dr. Christella Noa. Persistent pain, epidural has not been effective. She has seen Dr. Lynann Bologna. I think they prefer to transfer care to him. It sounds as though her surgery was canceled/postponed by Dr. Christella Noa. I would like him to discuss this with Dr. Christella Noa and Dr. Laurena Bering offices. I should not be the middleman here. Low-dose prednisone for 5 days per their request. Return to see me as needed.

## 2019-02-25 DIAGNOSIS — F411 Generalized anxiety disorder: Secondary | ICD-10-CM | POA: Diagnosis not present

## 2019-02-25 DIAGNOSIS — F331 Major depressive disorder, recurrent, moderate: Secondary | ICD-10-CM | POA: Diagnosis not present

## 2019-02-26 ENCOUNTER — Inpatient Hospital Stay: Admit: 2019-02-26 | Payer: Medicare Other

## 2019-02-26 SURGERY — POSTERIOR LUMBAR FUSION 2 LEVEL
Anesthesia: General

## 2019-03-01 DIAGNOSIS — R05 Cough: Secondary | ICD-10-CM | POA: Diagnosis not present

## 2019-03-01 DIAGNOSIS — R300 Dysuria: Secondary | ICD-10-CM | POA: Diagnosis not present

## 2019-03-01 DIAGNOSIS — R0602 Shortness of breath: Secondary | ICD-10-CM | POA: Diagnosis not present

## 2019-03-01 DIAGNOSIS — M7138 Other bursal cyst, other site: Secondary | ICD-10-CM | POA: Diagnosis not present

## 2019-03-01 DIAGNOSIS — R52 Pain, unspecified: Secondary | ICD-10-CM | POA: Diagnosis not present

## 2019-03-01 DIAGNOSIS — M8000XS Age-related osteoporosis with current pathological fracture, unspecified site, sequela: Secondary | ICD-10-CM | POA: Diagnosis not present

## 2019-03-02 ENCOUNTER — Other Ambulatory Visit: Payer: Self-pay | Admitting: Student

## 2019-03-02 DIAGNOSIS — M810 Age-related osteoporosis without current pathological fracture: Secondary | ICD-10-CM

## 2019-03-04 DIAGNOSIS — Z87891 Personal history of nicotine dependence: Secondary | ICD-10-CM | POA: Diagnosis not present

## 2019-03-04 DIAGNOSIS — I480 Paroxysmal atrial fibrillation: Secondary | ICD-10-CM | POA: Diagnosis present

## 2019-03-04 DIAGNOSIS — R05 Cough: Secondary | ICD-10-CM | POA: Diagnosis not present

## 2019-03-04 DIAGNOSIS — Z9841 Cataract extraction status, right eye: Secondary | ICD-10-CM | POA: Diagnosis not present

## 2019-03-04 DIAGNOSIS — I10 Essential (primary) hypertension: Secondary | ICD-10-CM | POA: Diagnosis present

## 2019-03-04 DIAGNOSIS — Z9842 Cataract extraction status, left eye: Secondary | ICD-10-CM | POA: Diagnosis not present

## 2019-03-04 DIAGNOSIS — Z87440 Personal history of urinary (tract) infections: Secondary | ICD-10-CM | POA: Diagnosis not present

## 2019-03-04 DIAGNOSIS — B9789 Other viral agents as the cause of diseases classified elsewhere: Secondary | ICD-10-CM | POA: Diagnosis not present

## 2019-03-04 DIAGNOSIS — K219 Gastro-esophageal reflux disease without esophagitis: Secondary | ICD-10-CM | POA: Diagnosis present

## 2019-03-04 DIAGNOSIS — R7989 Other specified abnormal findings of blood chemistry: Secondary | ICD-10-CM | POA: Diagnosis present

## 2019-03-04 DIAGNOSIS — E871 Hypo-osmolality and hyponatremia: Secondary | ICD-10-CM | POA: Diagnosis not present

## 2019-03-04 DIAGNOSIS — K449 Diaphragmatic hernia without obstruction or gangrene: Secondary | ICD-10-CM | POA: Diagnosis not present

## 2019-03-04 DIAGNOSIS — N390 Urinary tract infection, site not specified: Secondary | ICD-10-CM | POA: Diagnosis not present

## 2019-03-04 DIAGNOSIS — Z961 Presence of intraocular lens: Secondary | ICD-10-CM | POA: Diagnosis not present

## 2019-03-04 DIAGNOSIS — Z809 Family history of malignant neoplasm, unspecified: Secondary | ICD-10-CM | POA: Diagnosis not present

## 2019-03-04 DIAGNOSIS — R0600 Dyspnea, unspecified: Secondary | ICD-10-CM | POA: Diagnosis not present

## 2019-03-04 DIAGNOSIS — J988 Other specified respiratory disorders: Secondary | ICD-10-CM | POA: Diagnosis not present

## 2019-03-04 DIAGNOSIS — J123 Human metapneumovirus pneumonia: Secondary | ICD-10-CM | POA: Diagnosis not present

## 2019-03-04 DIAGNOSIS — F329 Major depressive disorder, single episode, unspecified: Secondary | ICD-10-CM | POA: Diagnosis present

## 2019-03-04 DIAGNOSIS — J9601 Acute respiratory failure with hypoxia: Secondary | ICD-10-CM | POA: Insufficient documentation

## 2019-03-04 DIAGNOSIS — Z87310 Personal history of (healed) osteoporosis fracture: Secondary | ICD-10-CM | POA: Diagnosis not present

## 2019-03-04 DIAGNOSIS — E222 Syndrome of inappropriate secretion of antidiuretic hormone: Secondary | ICD-10-CM | POA: Diagnosis not present

## 2019-03-04 DIAGNOSIS — J209 Acute bronchitis, unspecified: Secondary | ICD-10-CM | POA: Diagnosis not present

## 2019-03-04 DIAGNOSIS — M19049 Primary osteoarthritis, unspecified hand: Secondary | ICD-10-CM | POA: Diagnosis present

## 2019-03-04 DIAGNOSIS — M19079 Primary osteoarthritis, unspecified ankle and foot: Secondary | ICD-10-CM | POA: Diagnosis present

## 2019-03-04 DIAGNOSIS — D649 Anemia, unspecified: Secondary | ICD-10-CM | POA: Diagnosis present

## 2019-03-04 DIAGNOSIS — F419 Anxiety disorder, unspecified: Secondary | ICD-10-CM | POA: Diagnosis not present

## 2019-03-04 DIAGNOSIS — Z79899 Other long term (current) drug therapy: Secondary | ICD-10-CM | POA: Diagnosis not present

## 2019-03-04 DIAGNOSIS — J9801 Acute bronchospasm: Secondary | ICD-10-CM | POA: Diagnosis present

## 2019-03-04 DIAGNOSIS — I517 Cardiomegaly: Secondary | ICD-10-CM | POA: Diagnosis not present

## 2019-03-04 DIAGNOSIS — Z66 Do not resuscitate: Secondary | ICD-10-CM | POA: Diagnosis present

## 2019-03-15 DIAGNOSIS — F331 Major depressive disorder, recurrent, moderate: Secondary | ICD-10-CM | POA: Diagnosis not present

## 2019-03-15 DIAGNOSIS — F411 Generalized anxiety disorder: Secondary | ICD-10-CM | POA: Diagnosis not present

## 2019-03-25 DIAGNOSIS — F331 Major depressive disorder, recurrent, moderate: Secondary | ICD-10-CM | POA: Diagnosis not present

## 2019-03-25 DIAGNOSIS — F411 Generalized anxiety disorder: Secondary | ICD-10-CM | POA: Diagnosis not present

## 2019-03-31 DIAGNOSIS — I10 Essential (primary) hypertension: Secondary | ICD-10-CM | POA: Diagnosis not present

## 2019-03-31 DIAGNOSIS — Z043 Encounter for examination and observation following other accident: Secondary | ICD-10-CM | POA: Diagnosis not present

## 2019-03-31 DIAGNOSIS — M8000XD Age-related osteoporosis with current pathological fracture, unspecified site, subsequent encounter for fracture with routine healing: Secondary | ICD-10-CM | POA: Diagnosis not present

## 2019-03-31 DIAGNOSIS — S0990XA Unspecified injury of head, initial encounter: Secondary | ICD-10-CM | POA: Diagnosis not present

## 2019-03-31 DIAGNOSIS — S81811A Laceration without foreign body, right lower leg, initial encounter: Secondary | ICD-10-CM | POA: Diagnosis not present

## 2019-03-31 DIAGNOSIS — K219 Gastro-esophageal reflux disease without esophagitis: Secondary | ICD-10-CM | POA: Diagnosis not present

## 2019-03-31 DIAGNOSIS — Z87891 Personal history of nicotine dependence: Secondary | ICD-10-CM | POA: Diagnosis not present

## 2019-03-31 DIAGNOSIS — J129 Viral pneumonia, unspecified: Secondary | ICD-10-CM | POA: Diagnosis not present

## 2019-03-31 DIAGNOSIS — K644 Residual hemorrhoidal skin tags: Secondary | ICD-10-CM | POA: Diagnosis not present

## 2019-03-31 DIAGNOSIS — E871 Hypo-osmolality and hyponatremia: Secondary | ICD-10-CM | POA: Diagnosis not present

## 2019-03-31 DIAGNOSIS — F419 Anxiety disorder, unspecified: Secondary | ICD-10-CM | POA: Diagnosis not present

## 2019-03-31 DIAGNOSIS — Z79899 Other long term (current) drug therapy: Secondary | ICD-10-CM | POA: Diagnosis not present

## 2019-03-31 DIAGNOSIS — M19049 Primary osteoarthritis, unspecified hand: Secondary | ICD-10-CM | POA: Diagnosis not present

## 2019-03-31 DIAGNOSIS — D649 Anemia, unspecified: Secondary | ICD-10-CM | POA: Diagnosis not present

## 2019-03-31 DIAGNOSIS — M19079 Primary osteoarthritis, unspecified ankle and foot: Secondary | ICD-10-CM | POA: Diagnosis not present

## 2019-03-31 DIAGNOSIS — W010XXA Fall on same level from slipping, tripping and stumbling without subsequent striking against object, initial encounter: Secondary | ICD-10-CM | POA: Diagnosis not present

## 2019-03-31 DIAGNOSIS — M48061 Spinal stenosis, lumbar region without neurogenic claudication: Secondary | ICD-10-CM | POA: Diagnosis not present

## 2019-04-01 DIAGNOSIS — F331 Major depressive disorder, recurrent, moderate: Secondary | ICD-10-CM | POA: Diagnosis not present

## 2019-04-01 DIAGNOSIS — F411 Generalized anxiety disorder: Secondary | ICD-10-CM | POA: Diagnosis not present

## 2019-04-08 DIAGNOSIS — F411 Generalized anxiety disorder: Secondary | ICD-10-CM | POA: Diagnosis not present

## 2019-04-08 DIAGNOSIS — F331 Major depressive disorder, recurrent, moderate: Secondary | ICD-10-CM | POA: Diagnosis not present

## 2019-04-08 IMAGING — MR MR LUMBAR SPINE WO/W CM
5 of 7 series · 26 of 48 positions shown · IV contrast (multihance)
Comparison: Plain films 08/05/2018.  MRI lumbar spine 10/27/2017.

CLINICAL DATA: RIGHT lower back pain and buttock pain for 4 months.
Previous lumbar surgery.

EXAM:
MRI LUMBAR SPINE WITHOUT AND WITH CONTRAST
TECHNIQUE: Multiplanar and multiecho pulse sequences of the lumbar spine were
obtained without and with intravenous contrast.
CONTRAST:  12mL MULTIHANCE GADOBENATE DIMEGLUMINE 529 MG/ML IV SOLN

[Series 3: T1 · sagittal · 4.0mm · 0.51mm/px · 5 of 15 slices shown (1 of 2)]
[im 1/15]
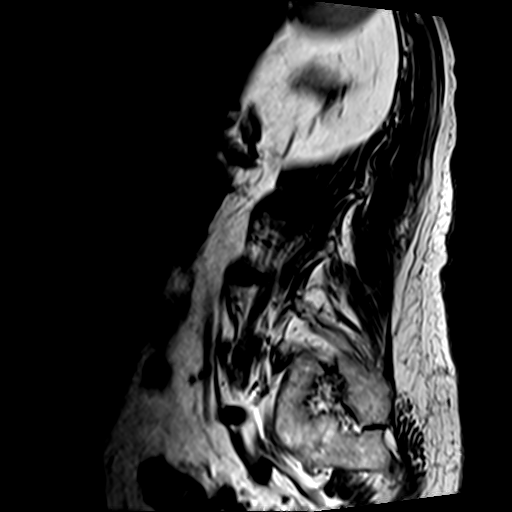
[im 4/15]
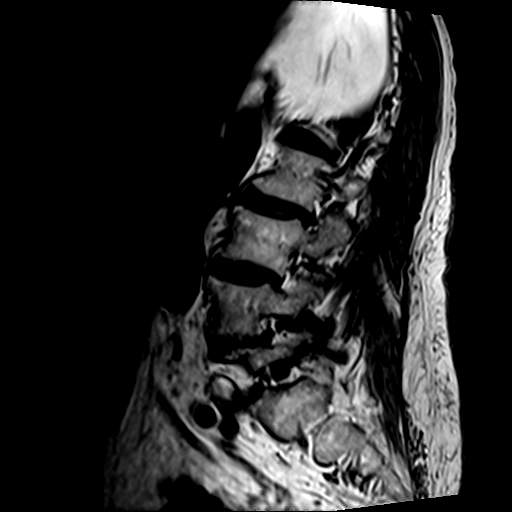
[im 8/15]
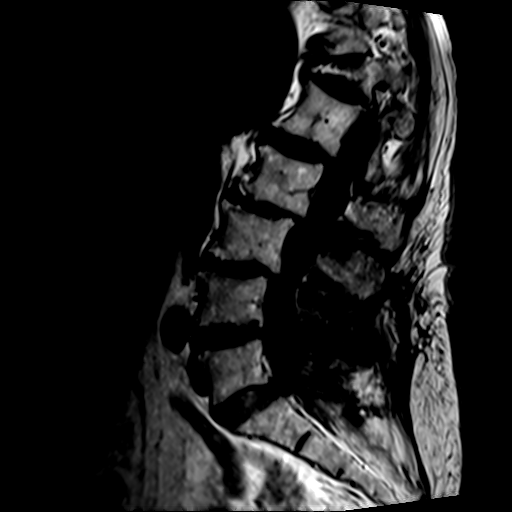
[im 11/15]
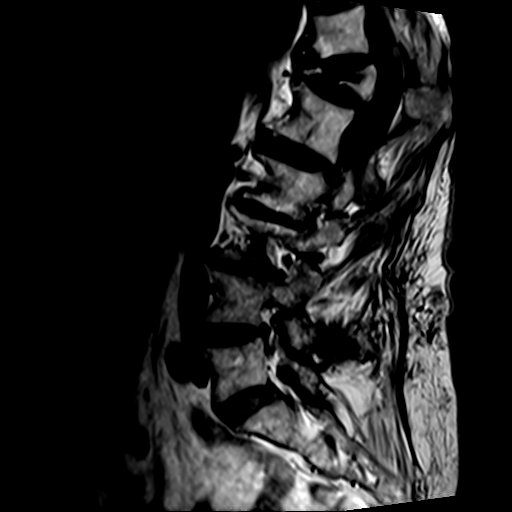
[im 15/15]
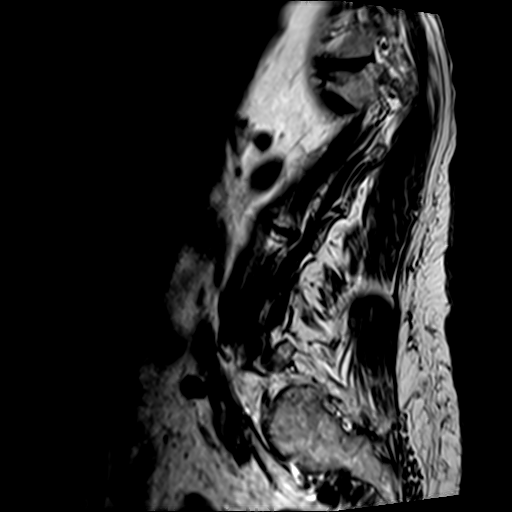

[Series 5: T2 · axial · 4.0mm · 0.39mm/px · z∈[-49,+152]mm · 8 of 34 slices shown (1 of 2)]
[im 1/34]
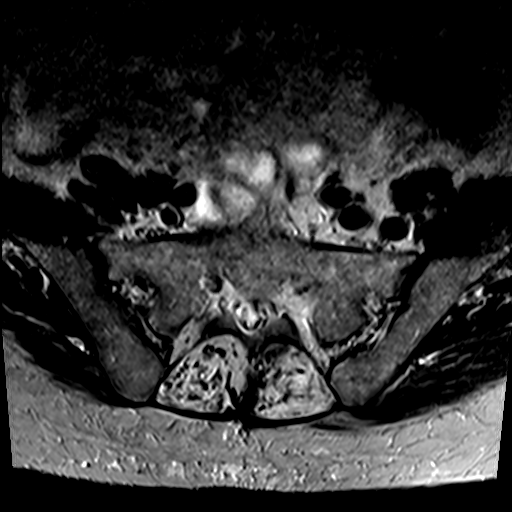
[im 4/34]
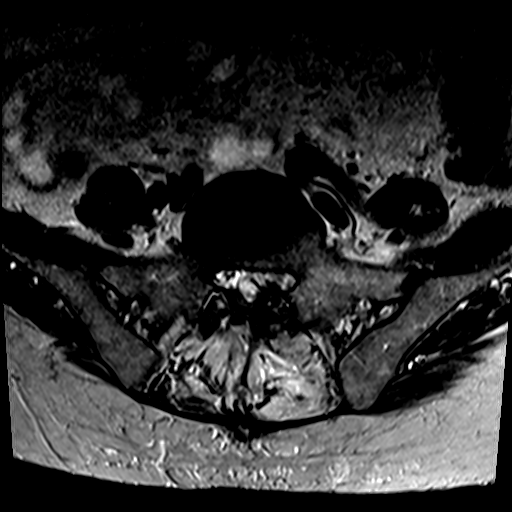
[im 12/34]
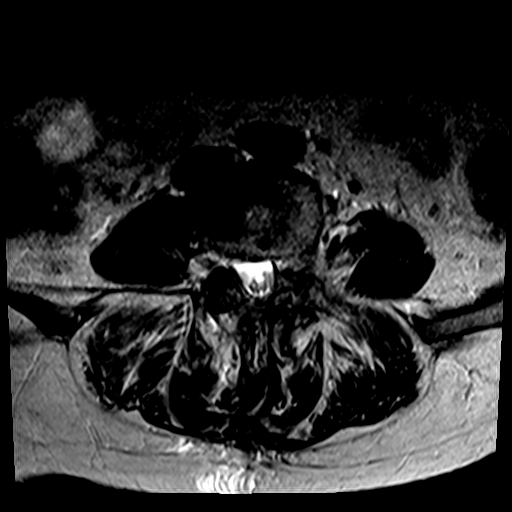
[im 15/34]
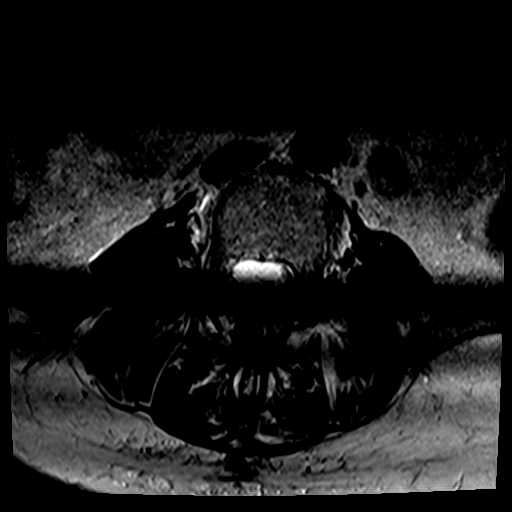
[im 19/34]
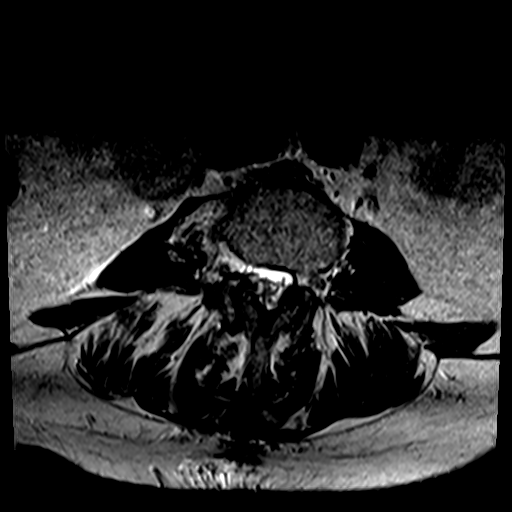
[im 23/34]
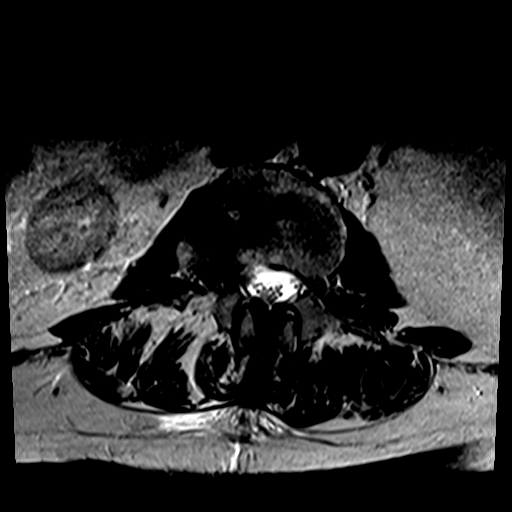
[im 30/34]
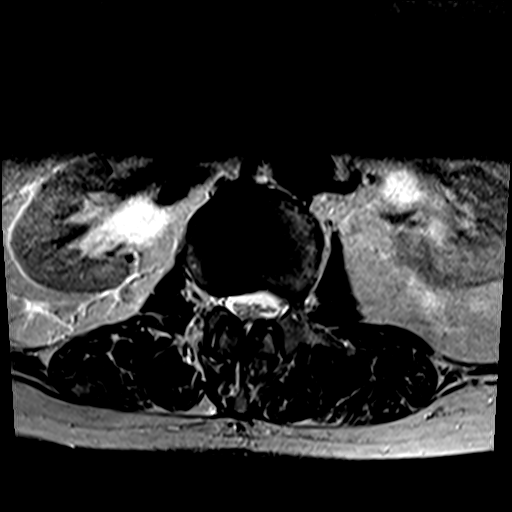
[im 34/34]
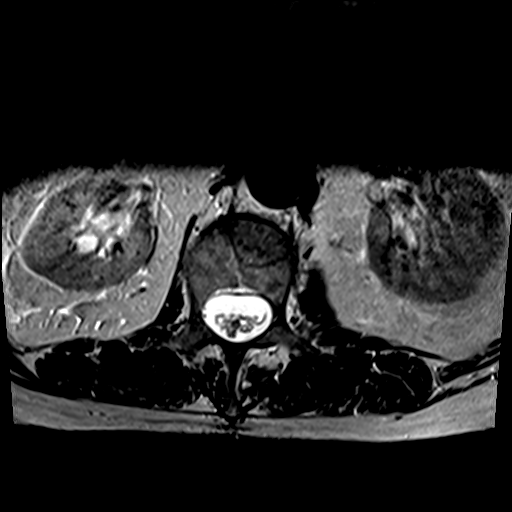

[Series 6: T1 · axial · 4.0mm · 0.78mm/px · z∈[-49,+152]mm · 8 of 34 slices shown (2 of 2)]
[im 1/34]
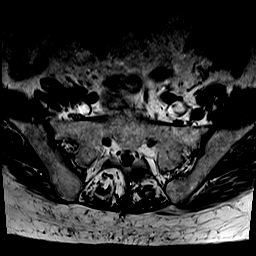
[im 4/34]
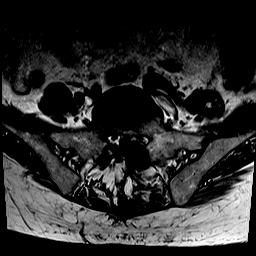
[im 12/34]
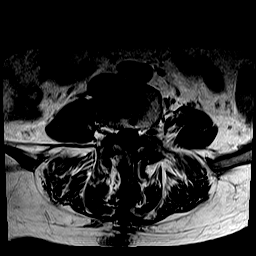
[im 15/34]
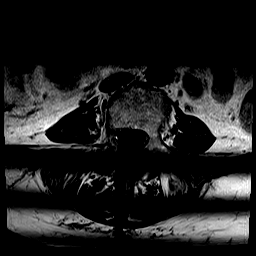
[im 19/34]
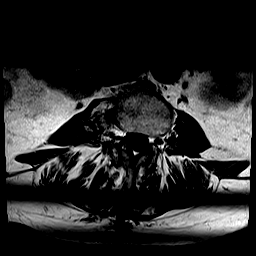
[im 23/34]
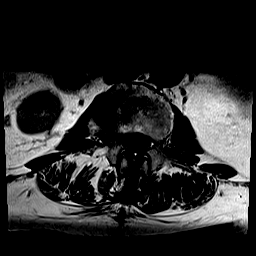
[im 30/34]
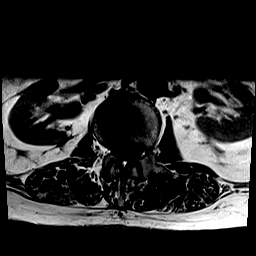
[im 34/34]
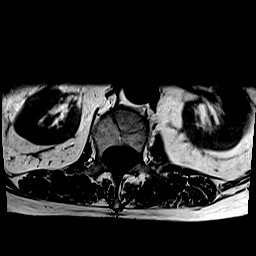

[Series 7: T2 · sagittal · 4.0mm · 1.02mm/px · 4 of 15 slices shown (2 of 2)]
[im 1/15]
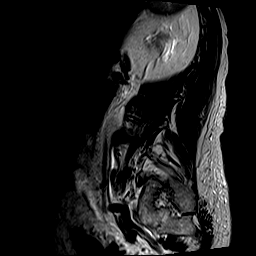
[im 5/15]
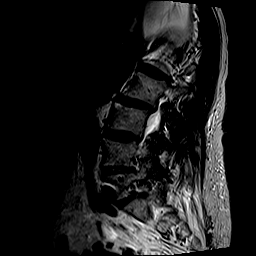
[im 10/15]
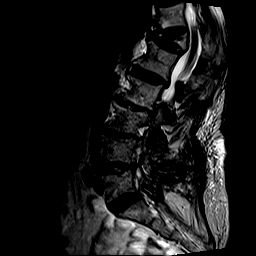
[im 15/15]
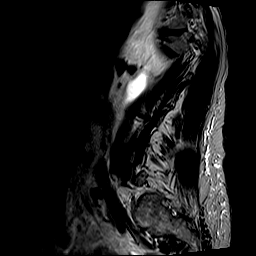

[Series 8: T1 fat-sat post-contrast · sagittal · 4.0mm · 0.51mm/px · 1 of 15 slices shown]
[im 1/15]
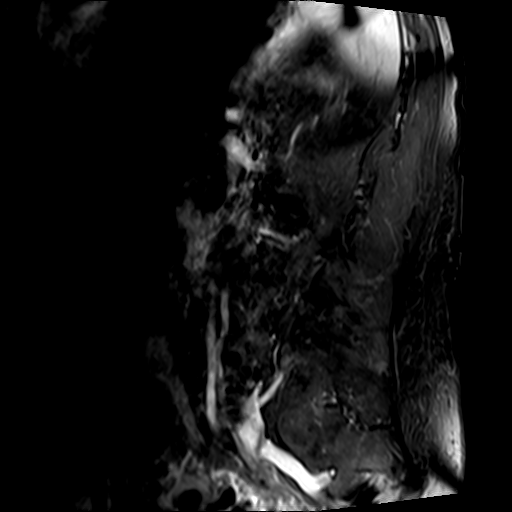

[26 of 48 positions shown; findings below may reference images not displayed]

FINDINGS: Segmentation:  Standard, continued from priors.

Alignment: S-shaped curvature to the spine, degenerative scoliosis
convex LEFT mid lumbar region. Approximate 3 mm anterolisthesis
L5-S1, with compensatory retrolisthesis of a similar degree at L2-3
and L3-4.

Vertebrae: Near complete collapse of T12, despite prior vertebral
augmentation. Retropulsed bone in the canal does not compress the
conus. No residual abnormal enhancement at T12. Laminectomy changes
at L4.

Conus medullaris and cauda equina: Conus extends to the L1 level.
Conus and cauda equina appear normal.

Paraspinal and other soft tissues: Postoperative changes related to
L4, L5, and LEFT S1 laminectomies. Soft tissue enhancement is
suspected. No concerning postoperative fluid collection.

Disc levels:

L1-L2: Shallow protrusion, trace retrolisthesis, and posterior
element hypertrophy. Mild canal stenosis. Severe RIGHT foraminal
narrowing related to scoliotic deformity.

L2-L3: Advanced disc space narrowing. 3 mm retrolisthesis.
Uncovering of the disc. Severe RIGHT and moderate LEFT foraminal
stenosis. Mild canal stenosis.

L3-L4: Severe disc space narrowing. 3 mm retrolisthesis. Posterior
element hypertrophy. Mild stenosis. No definite compressive
subarticular zone or foraminal zone narrowing.

L4-L5: Postoperative enhancing granulation tissue at the laminectomy
site projects into the spinal canal, greater on the RIGHT. See
series 9, image 25, series 8, image 7. In conjunction with posterior
element hypertrophy, there is moderate stenosis. RIGHT L5 neural
impingement is possible. LEFT-sided foraminal narrowing is not
clearly compressive.

L5-S1: 3 mm anterolisthesis. Facet arthropathy. Laminectomy with
enhancing granulation tissue surrounding the thecal sac. The
synovial cyst has been removed, but severe canal stenosis and
deformity of the thecal sac remains. See axial postcontrast series
9, image 29 and 30. Mass effect remains on the LEFT S1 nerve root,
due to residual bony overgrowth. There may be a recurrent small LEFT
synovial cyst, 5 mm, see series 9, image 29. LEFT L5 foraminal
narrowing is not clearly compressive.
IMPRESSION: Status post L4, L5, and LEFT S1 laminectomies for decompression of
spinal stenosis and removal of synovial cyst.

No concerning postoperative fluid collections or evidence of
discitis/osteomyelitis.

Enhancing granulation tissue surrounds the thecal sac at L5-S1, and
projects into the spinal canal dorsally at L4-5, either/both of
which could result in symptomatic neural impingement. These changes
are more pronounced at L5-S1 where bony overgrowth, and possible
recurrent synovial cyst, result in significant deformity and
narrowing of the thecal sac.

If further investigation desired, consider myelogram with
postmyelogram CT to evaluate these morphologic changes.

## 2019-04-15 DIAGNOSIS — F331 Major depressive disorder, recurrent, moderate: Secondary | ICD-10-CM | POA: Diagnosis not present

## 2019-04-15 DIAGNOSIS — F411 Generalized anxiety disorder: Secondary | ICD-10-CM | POA: Diagnosis not present

## 2019-04-21 DIAGNOSIS — F331 Major depressive disorder, recurrent, moderate: Secondary | ICD-10-CM | POA: Diagnosis not present

## 2019-04-21 DIAGNOSIS — F411 Generalized anxiety disorder: Secondary | ICD-10-CM | POA: Diagnosis not present

## 2019-04-22 DIAGNOSIS — F411 Generalized anxiety disorder: Secondary | ICD-10-CM | POA: Diagnosis not present

## 2019-04-22 DIAGNOSIS — F331 Major depressive disorder, recurrent, moderate: Secondary | ICD-10-CM | POA: Diagnosis not present

## 2019-04-29 ENCOUNTER — Other Ambulatory Visit: Payer: Medicare Other

## 2019-04-29 DIAGNOSIS — F331 Major depressive disorder, recurrent, moderate: Secondary | ICD-10-CM | POA: Diagnosis not present

## 2019-04-29 DIAGNOSIS — F411 Generalized anxiety disorder: Secondary | ICD-10-CM | POA: Diagnosis not present

## 2019-05-03 ENCOUNTER — Other Ambulatory Visit: Payer: Self-pay

## 2019-05-03 ENCOUNTER — Ambulatory Visit
Admission: RE | Admit: 2019-05-03 | Discharge: 2019-05-03 | Disposition: A | Discharge status: Home / Self Care | Payer: Medicare Other | Source: Ambulatory Visit | Attending: Student | Admitting: Student

## 2019-05-03 DIAGNOSIS — Z13820 Encounter for screening for osteoporosis: Secondary | ICD-10-CM | POA: Diagnosis not present

## 2019-05-03 DIAGNOSIS — M810 Age-related osteoporosis without current pathological fracture: Secondary | ICD-10-CM

## 2019-05-06 DIAGNOSIS — F411 Generalized anxiety disorder: Secondary | ICD-10-CM | POA: Diagnosis not present

## 2019-05-06 DIAGNOSIS — F331 Major depressive disorder, recurrent, moderate: Secondary | ICD-10-CM | POA: Diagnosis not present

## 2019-05-12 DIAGNOSIS — M48062 Spinal stenosis, lumbar region with neurogenic claudication: Secondary | ICD-10-CM | POA: Diagnosis not present

## 2019-05-12 DIAGNOSIS — M5136 Other intervertebral disc degeneration, lumbar region: Secondary | ICD-10-CM | POA: Diagnosis not present

## 2019-05-12 DIAGNOSIS — M5442 Lumbago with sciatica, left side: Secondary | ICD-10-CM | POA: Diagnosis not present

## 2019-05-12 DIAGNOSIS — M7138 Other bursal cyst, other site: Secondary | ICD-10-CM | POA: Diagnosis not present

## 2019-05-12 DIAGNOSIS — M5441 Lumbago with sciatica, right side: Secondary | ICD-10-CM | POA: Diagnosis not present

## 2019-05-12 DIAGNOSIS — G8929 Other chronic pain: Secondary | ICD-10-CM | POA: Diagnosis not present

## 2019-05-12 DIAGNOSIS — M5416 Radiculopathy, lumbar region: Secondary | ICD-10-CM | POA: Diagnosis not present

## 2019-05-13 DIAGNOSIS — F411 Generalized anxiety disorder: Secondary | ICD-10-CM | POA: Diagnosis not present

## 2019-05-13 DIAGNOSIS — F331 Major depressive disorder, recurrent, moderate: Secondary | ICD-10-CM | POA: Diagnosis not present

## 2019-05-17 ENCOUNTER — Other Ambulatory Visit: Payer: Medicare Other

## 2019-05-19 ENCOUNTER — Other Ambulatory Visit: Payer: Self-pay | Admitting: Sports Medicine

## 2019-05-19 DIAGNOSIS — M48061 Spinal stenosis, lumbar region without neurogenic claudication: Secondary | ICD-10-CM

## 2019-05-19 MED ORDER — GABAPENTIN 300 MG PO CAPS: 300.0000 mg | ORAL_CAPSULE | Freq: Three times a day (TID) | ORAL | 1 refills | Status: DC

## 2019-05-27 DIAGNOSIS — F411 Generalized anxiety disorder: Secondary | ICD-10-CM | POA: Diagnosis not present

## 2019-05-27 DIAGNOSIS — F331 Major depressive disorder, recurrent, moderate: Secondary | ICD-10-CM | POA: Diagnosis not present

## 2019-06-03 DIAGNOSIS — F411 Generalized anxiety disorder: Secondary | ICD-10-CM | POA: Diagnosis not present

## 2019-06-03 DIAGNOSIS — F331 Major depressive disorder, recurrent, moderate: Secondary | ICD-10-CM | POA: Diagnosis not present

## 2019-06-10 DIAGNOSIS — F411 Generalized anxiety disorder: Secondary | ICD-10-CM | POA: Diagnosis not present

## 2019-06-10 DIAGNOSIS — F331 Major depressive disorder, recurrent, moderate: Secondary | ICD-10-CM | POA: Diagnosis not present

## 2019-06-14 ENCOUNTER — Ambulatory Visit (INDEPENDENT_AMBULATORY_CARE_PROVIDER_SITE_OTHER): Payer: Medicare Other

## 2019-06-14 ENCOUNTER — Ambulatory Visit (INDEPENDENT_AMBULATORY_CARE_PROVIDER_SITE_OTHER): Payer: Medicare Other | Admitting: Sports Medicine

## 2019-06-14 ENCOUNTER — Encounter: Payer: Self-pay | Admitting: Sports Medicine

## 2019-06-14 ENCOUNTER — Other Ambulatory Visit: Payer: Self-pay

## 2019-06-14 DIAGNOSIS — S99922A Unspecified injury of left foot, initial encounter: Secondary | ICD-10-CM

## 2019-06-14 DIAGNOSIS — S92425A Nondisplaced fracture of distal phalanx of left great toe, initial encounter for closed fracture: Secondary | ICD-10-CM | POA: Diagnosis not present

## 2019-06-14 DIAGNOSIS — S92401A Displaced unspecified fracture of right great toe, initial encounter for closed fracture: Secondary | ICD-10-CM | POA: Insufficient documentation

## 2019-06-14 MED ORDER — HYDROCODONE-ACETAMINOPHEN 5-325 MG PO TABS: 1.0000 | ORAL_TABLET | Freq: Three times a day (TID) | ORAL | 0 refills | Status: DC | PRN

## 2019-06-14 MED ORDER — DICLOFENAC SODIUM 75 MG PO TBEC: 75.0000 mg | DELAYED_RELEASE_TABLET | Freq: Two times a day (BID) | ORAL | 3 refills | Status: AC

## 2019-06-14 NOTE — Progress Notes (Signed)
Subjective:    CC: Toe injury  HPI: Deborah Robbins is a very pleasant 85 year old female, recently she tripped over a carpet, and hyper plantar flexed her left great toe, she had immediate pain, swelling, bruising.  Localized without radiation, she is here for further evaluation and definitive treatment.  I reviewed the past medical history, family history, social history, surgical history, and allergies today and no changes were needed.  Please see the problem list section below in epic for further details.  Past Medical History: Past Medical History:  Diagnosis Date  . Anemia    low iron  . Anxiety   . Arthritis   . Depression   . Diverticulitis   . Frequent urinary tract infections   . GERD (gastroesophageal reflux disease)   . Hypertension   . Mitral regurgitation   . Osteoporosis   . PAF (paroxysmal atrial fibrillation) ([place])    03/2017 admitted to [facility] with afib with RVR-->SR, discharged on diltiazem (had drop in H/H on anticoag, so d/c'd)  . PONV (postoperative nausea and vomiting)    Past Surgical History: Past Surgical History:  Procedure Laterality Date  . ABDOMINAL HYSTERECTOMY    . APPENDECTOMY    . CHOLECYSTECTOMY    . COLONOSCOPY    . HERNIA REPAIR     abdominal hernia  . KNEE ARTHROSCOPY W/ MENISCAL REPAIR Right   . KYPHOPLASTY    . LUMBAR LAMINECTOMY/DECOMPRESSION MICRODISCECTOMY Left 04/09/2018   Procedure: LAMINECTOMY FOR FACET/SYNOVIAL CYST LUMBAR FOUR   - LUMBAR FIVE , LUMBAR FIVE  - SACRAL ONE  LEFT;  Surgeon: Ashok Pall, MD;  Location: [place];  Service: Neurosurgery;  Laterality: Left;   Social History: Social History   Socioeconomic History  . Marital status: Widowed    Spouse name: Not on file  . Number of children: Not on file  . Years of education: Not on file  . Highest education level: Not on file  Occupational History  . Not on file  Social Needs  . Financial resource strain: Not on file  . Food insecurity    Worry: Not on file   Inability: Not on file  . Transportation needs    Medical: Not on file    Non-medical: Not on file  Tobacco Use  . Smoking status: Former Research scientist (life sciences)  . Smokeless tobacco: Never Used  Substance and Sexual Activity  . Alcohol use: No  . Drug use: No  . Sexual activity: Never  Lifestyle  . Physical activity    Days per week: Not on file    Minutes per session: Not on file  . Stress: Not on file  Relationships  . Social Herbalist on phone: Not on file    Gets together: Not on file    Attends religious service: Not on file    Active member of club or organization: Not on file    Attends meetings of clubs or organizations: Not on file    Relationship status: Not on file  Other Topics Concern  . Not on file  Social History Narrative  . Not on file   Family History: No family history on file. Allergies: No Known Allergies Medications: See med rec.  Review of Systems: No fevers, chills, night sweats, weight loss, chest pain, or shortness of breath.   Objective:    General: Well Developed, well nourished, and in no acute distress.  Neuro: Alert and oriented x3, extra-ocular muscles intact, sensation grossly intact.  HEENT: Normocephalic, atraumatic, pupils equal  round reactive to light, neck supple, no masses, no lymphadenopathy, thyroid nonpalpable.  Skin: Warm and dry, no rashes. Cardiac: Regular rate and rhythm, no murmurs rubs or gallops, no lower extremity edema.  Respiratory: Clear to auscultation bilaterally. Not using accessory muscles, speaking in full sentences. Left foot: Great toe is swollen, bruised with tenderness at the base of the distal phalanx.  X-rays personally reviewed, there does appear to be a fracture of the dorsal through plantar base of the first distal phalanx.  Postop shoe applied.  Impression and Recommendations:    Injury of left foot Hyper plantar flexion injury of the left great toe. Tender over the distal phalanx. X-rays, Voltaren,  hydrocodone. Postop shoe. She had a couple of skin abrasions on her shins that were dressed.   ___________________________________________ Gwen Her. Dianah Field, M.D., ABFM., CAQSM. Primary Care and Sports Medicine [facility]  Adjunct Professor of [place] [employer] [facility] [employer]

## 2019-06-14 NOTE — Assessment & Plan Note (Signed)
Hyper plantar flexion injury of the left great toe. Tender over the distal phalanx. X-rays, Voltaren, hydrocodone. Postop shoe. She had a couple of skin abrasions on her shins that were dressed.

## 2019-06-16 DIAGNOSIS — M5416 Radiculopathy, lumbar region: Secondary | ICD-10-CM | POA: Diagnosis not present

## 2019-06-16 DIAGNOSIS — M5442 Lumbago with sciatica, left side: Secondary | ICD-10-CM | POA: Diagnosis not present

## 2019-06-16 DIAGNOSIS — M5441 Lumbago with sciatica, right side: Secondary | ICD-10-CM | POA: Diagnosis not present

## 2019-06-16 DIAGNOSIS — M7138 Other bursal cyst, other site: Secondary | ICD-10-CM | POA: Diagnosis not present

## 2019-06-16 DIAGNOSIS — M48062 Spinal stenosis, lumbar region with neurogenic claudication: Secondary | ICD-10-CM | POA: Diagnosis not present

## 2019-06-16 DIAGNOSIS — M5136 Other intervertebral disc degeneration, lumbar region: Secondary | ICD-10-CM | POA: Diagnosis not present

## 2019-06-16 DIAGNOSIS — G8929 Other chronic pain: Secondary | ICD-10-CM | POA: Diagnosis not present

## 2019-06-17 DIAGNOSIS — F331 Major depressive disorder, recurrent, moderate: Secondary | ICD-10-CM | POA: Diagnosis not present

## 2019-06-17 DIAGNOSIS — F411 Generalized anxiety disorder: Secondary | ICD-10-CM | POA: Diagnosis not present

## 2019-06-21 DIAGNOSIS — R319 Hematuria, unspecified: Secondary | ICD-10-CM | POA: Diagnosis not present

## 2019-06-21 DIAGNOSIS — N390 Urinary tract infection, site not specified: Secondary | ICD-10-CM | POA: Diagnosis not present

## 2019-06-21 DIAGNOSIS — R300 Dysuria: Secondary | ICD-10-CM | POA: Diagnosis not present

## 2019-06-23 DIAGNOSIS — D649 Anemia, unspecified: Secondary | ICD-10-CM | POA: Diagnosis not present

## 2019-06-23 DIAGNOSIS — I10 Essential (primary) hypertension: Secondary | ICD-10-CM | POA: Diagnosis not present

## 2019-06-23 DIAGNOSIS — M5416 Radiculopathy, lumbar region: Secondary | ICD-10-CM | POA: Diagnosis not present

## 2019-06-23 DIAGNOSIS — N183 Chronic kidney disease, stage 3 (moderate): Secondary | ICD-10-CM | POA: Diagnosis not present

## 2019-06-23 DIAGNOSIS — Z01818 Encounter for other preprocedural examination: Secondary | ICD-10-CM | POA: Diagnosis not present

## 2019-06-23 DIAGNOSIS — M549 Dorsalgia, unspecified: Secondary | ICD-10-CM | POA: Diagnosis not present

## 2019-06-23 DIAGNOSIS — N390 Urinary tract infection, site not specified: Secondary | ICD-10-CM | POA: Diagnosis not present

## 2019-06-23 DIAGNOSIS — Z01812 Encounter for preprocedural laboratory examination: Secondary | ICD-10-CM | POA: Diagnosis not present

## 2019-06-23 DIAGNOSIS — Z87891 Personal history of nicotine dependence: Secondary | ICD-10-CM | POA: Diagnosis not present

## 2019-06-23 DIAGNOSIS — F419 Anxiety disorder, unspecified: Secondary | ICD-10-CM | POA: Diagnosis not present

## 2019-06-23 DIAGNOSIS — I129 Hypertensive chronic kidney disease with stage 1 through stage 4 chronic kidney disease, or unspecified chronic kidney disease: Secondary | ICD-10-CM | POA: Diagnosis not present

## 2019-06-24 DIAGNOSIS — F331 Major depressive disorder, recurrent, moderate: Secondary | ICD-10-CM | POA: Diagnosis not present

## 2019-06-24 DIAGNOSIS — F411 Generalized anxiety disorder: Secondary | ICD-10-CM | POA: Diagnosis not present

## 2019-06-28 ENCOUNTER — Ambulatory Visit: Payer: Medicare Other | Admitting: Sports Medicine

## 2019-06-28 DIAGNOSIS — Z87891 Personal history of nicotine dependence: Secondary | ICD-10-CM | POA: Diagnosis not present

## 2019-06-28 DIAGNOSIS — Z01812 Encounter for preprocedural laboratory examination: Secondary | ICD-10-CM | POA: Diagnosis not present

## 2019-06-28 DIAGNOSIS — I129 Hypertensive chronic kidney disease with stage 1 through stage 4 chronic kidney disease, or unspecified chronic kidney disease: Secondary | ICD-10-CM | POA: Diagnosis not present

## 2019-06-28 DIAGNOSIS — N183 Chronic kidney disease, stage 3 (moderate): Secondary | ICD-10-CM | POA: Diagnosis not present

## 2019-06-28 DIAGNOSIS — Z1159 Encounter for screening for other viral diseases: Secondary | ICD-10-CM | POA: Diagnosis not present

## 2019-06-30 DIAGNOSIS — I083 Combined rheumatic disorders of mitral, aortic and tricuspid valves: Secondary | ICD-10-CM | POA: Diagnosis not present

## 2019-06-30 DIAGNOSIS — I509 Heart failure, unspecified: Secondary | ICD-10-CM | POA: Diagnosis not present

## 2019-06-30 DIAGNOSIS — I517 Cardiomegaly: Secondary | ICD-10-CM | POA: Diagnosis not present

## 2019-07-01 DIAGNOSIS — I351 Nonrheumatic aortic (valve) insufficiency: Secondary | ICD-10-CM | POA: Diagnosis not present

## 2019-07-01 DIAGNOSIS — I340 Nonrheumatic mitral (valve) insufficiency: Secondary | ICD-10-CM | POA: Diagnosis not present

## 2019-07-01 DIAGNOSIS — I480 Paroxysmal atrial fibrillation: Secondary | ICD-10-CM | POA: Diagnosis not present

## 2019-07-01 DIAGNOSIS — I10 Essential (primary) hypertension: Secondary | ICD-10-CM | POA: Diagnosis not present

## 2019-07-08 DIAGNOSIS — F411 Generalized anxiety disorder: Secondary | ICD-10-CM | POA: Diagnosis not present

## 2019-07-08 DIAGNOSIS — F331 Major depressive disorder, recurrent, moderate: Secondary | ICD-10-CM | POA: Diagnosis not present

## 2019-07-12 DIAGNOSIS — M5136 Other intervertebral disc degeneration, lumbar region: Secondary | ICD-10-CM | POA: Diagnosis not present

## 2019-07-12 DIAGNOSIS — M48062 Spinal stenosis, lumbar region with neurogenic claudication: Secondary | ICD-10-CM | POA: Diagnosis not present

## 2019-07-12 DIAGNOSIS — M48061 Spinal stenosis, lumbar region without neurogenic claudication: Secondary | ICD-10-CM | POA: Diagnosis not present

## 2019-07-12 DIAGNOSIS — M4726 Other spondylosis with radiculopathy, lumbar region: Secondary | ICD-10-CM | POA: Diagnosis not present

## 2019-07-12 DIAGNOSIS — N390 Urinary tract infection, site not specified: Secondary | ICD-10-CM | POA: Diagnosis not present

## 2019-07-12 DIAGNOSIS — Z87440 Personal history of urinary (tract) infections: Secondary | ICD-10-CM | POA: Diagnosis not present

## 2019-07-12 DIAGNOSIS — R319 Hematuria, unspecified: Secondary | ICD-10-CM | POA: Diagnosis not present

## 2019-07-13 DIAGNOSIS — I10 Essential (primary) hypertension: Secondary | ICD-10-CM | POA: Diagnosis not present

## 2019-07-13 DIAGNOSIS — I480 Paroxysmal atrial fibrillation: Secondary | ICD-10-CM | POA: Diagnosis not present

## 2019-07-15 DIAGNOSIS — F331 Major depressive disorder, recurrent, moderate: Secondary | ICD-10-CM | POA: Diagnosis not present

## 2019-07-15 DIAGNOSIS — F411 Generalized anxiety disorder: Secondary | ICD-10-CM | POA: Diagnosis not present

## 2019-07-22 DIAGNOSIS — F331 Major depressive disorder, recurrent, moderate: Secondary | ICD-10-CM | POA: Diagnosis not present

## 2019-07-22 DIAGNOSIS — F411 Generalized anxiety disorder: Secondary | ICD-10-CM | POA: Diagnosis not present

## 2019-07-26 DIAGNOSIS — I480 Paroxysmal atrial fibrillation: Secondary | ICD-10-CM | POA: Diagnosis not present

## 2019-07-29 DIAGNOSIS — F331 Major depressive disorder, recurrent, moderate: Secondary | ICD-10-CM | POA: Diagnosis not present

## 2019-07-29 DIAGNOSIS — F411 Generalized anxiety disorder: Secondary | ICD-10-CM | POA: Diagnosis not present

## 2019-08-02 ENCOUNTER — Other Ambulatory Visit: Payer: Self-pay

## 2019-08-02 ENCOUNTER — Ambulatory Visit (INDEPENDENT_AMBULATORY_CARE_PROVIDER_SITE_OTHER): Payer: Medicare Other

## 2019-08-02 ENCOUNTER — Ambulatory Visit (INDEPENDENT_AMBULATORY_CARE_PROVIDER_SITE_OTHER): Payer: Medicare Other | Admitting: Sports Medicine

## 2019-08-02 ENCOUNTER — Encounter: Payer: Self-pay | Admitting: Sports Medicine

## 2019-08-02 DIAGNOSIS — M19041 Primary osteoarthritis, right hand: Secondary | ICD-10-CM | POA: Diagnosis not present

## 2019-08-02 DIAGNOSIS — R29898 Other symptoms and signs involving the musculoskeletal system: Secondary | ICD-10-CM

## 2019-08-02 DIAGNOSIS — R531 Weakness: Secondary | ICD-10-CM | POA: Diagnosis not present

## 2019-08-02 DIAGNOSIS — S99922D Unspecified injury of left foot, subsequent encounter: Secondary | ICD-10-CM | POA: Diagnosis not present

## 2019-08-02 NOTE — Progress Notes (Addendum)
Subjective:    CC: Right hand problem  HPI: For the past several weeks this 85 year old female has noted an incidental inability to extend her fourth and fifth fingers of her right hand.  She denies any trauma, no numbness or tingling into the hand, wrist, fingertips.  Symptoms are moderate, persistent.  I reviewed the past medical history, family history, social history, surgical history, and allergies today and no changes were needed.  Please see the problem list section below in epic for further details.  Past Medical History: Past Medical History:  Diagnosis Date  . Anemia    low iron  . Anxiety   . Arthritis   . Depression   . Diverticulitis   . Frequent urinary tract infections   . GERD (gastroesophageal reflux disease)   . Hypertension   . Mitral regurgitation   . Osteoporosis   . PAF (paroxysmal atrial fibrillation) ([place])    03/2017 admitted to [facility] with afib with RVR-->SR, discharged on diltiazem (had drop in H/H on anticoag, so d/c'd)  . PONV (postoperative nausea and vomiting)    Past Surgical History: Past Surgical History:  Procedure Laterality Date  . ABDOMINAL HYSTERECTOMY    . APPENDECTOMY    . CHOLECYSTECTOMY    . COLONOSCOPY    . HERNIA REPAIR     abdominal hernia  . KNEE ARTHROSCOPY W/ MENISCAL REPAIR Right   . KYPHOPLASTY    . LUMBAR LAMINECTOMY/DECOMPRESSION MICRODISCECTOMY Left 04/09/2018   Procedure: LAMINECTOMY FOR FACET/SYNOVIAL CYST LUMBAR FOUR   - LUMBAR FIVE , LUMBAR FIVE  - SACRAL ONE  LEFT;  Surgeon: Ashok Pall, MD;  Location: [place];  Service: Neurosurgery;  Laterality: Left;   Social History: Social History   Socioeconomic History  . Marital status: Widowed    Spouse name: Not on file  . Number of children: Not on file  . Years of education: Not on file  . Highest education level: Not on file  Occupational History  . Not on file  Social Needs  . Financial resource strain: Not on file  . Food insecurity    Worry: Not on  file    Inability: Not on file  . Transportation needs    Medical: Not on file    Non-medical: Not on file  Tobacco Use  . Smoking status: Former Research scientist (life sciences)  . Smokeless tobacco: Never Used  Substance and Sexual Activity  . Alcohol use: No  . Drug use: No  . Sexual activity: Never  Lifestyle  . Physical activity    Days per week: Not on file    Minutes per session: Not on file  . Stress: Not on file  Relationships  . Social Herbalist on phone: Not on file    Gets together: Not on file    Attends religious service: Not on file    Active member of club or organization: Not on file    Attends meetings of clubs or organizations: Not on file    Relationship status: Not on file  Other Topics Concern  . Not on file  Social History Narrative  . Not on file   Family History: No family history on file. Allergies: No Known Allergies Medications: See med rec.  Review of Systems: No fevers, chills, night sweats, weight loss, chest pain, or shortness of breath.   Objective:    General: Well Developed, well nourished, and in no acute distress.  Neuro: Alert and oriented x3, extra-ocular muscles intact, sensation grossly intact.  HEENT: Normocephalic, atraumatic, pupils equal round reactive to light, neck supple, no masses, no lymphadenopathy, thyroid nonpalpable.  Skin: Warm and dry, no rashes. Cardiac: Regular rate and rhythm, no murmurs rubs or gallops, no lower extremity edema.  Respiratory: Clear to auscultation bilaterally. Not using accessory muscles, speaking in full sentences. Right hand: 0/5 extension strength at the fourth and fifth MCPs, good extension of the first through third fingers, good wrist extension, neurovascularly intact distally.  Impression and Recommendations:    Weakness of right hand Significant weakness to extension at the fourth and fifth digits at the MCP. Exam is for the most part unremarkable. It is possible she has a rupture of the  extensor digiti minimi and extensor digitorum to the fourth finger. Fiberglass extension splint was fashioned today. X-rays of the hand and wrist, MRI of the hand and wrist, home health physical therapy. Return to see me in 1 month.  MRI reviewed and there does appear to be evidence of degenerative ruptures of the fourth common extensor and the extensor digiti minimi tendons, I am happy to refer her to a hand surgeon for discussion of surgical repair.  Injury of left foot There was a fracture over the dorsal great toe, this is healed, pain-free now. May going to a regular shoe, return as needed for this.   ___________________________________________ Gwen Her. Dianah Field, M.D., ABFM., CAQSM. Primary Care and Sports Medicine [facility]  Adjunct Professor of [place] [employer] [facility] [employer]

## 2019-08-02 NOTE — Assessment & Plan Note (Signed)
There was a fracture over the dorsal great toe, this is healed, pain-free now. May going to a regular shoe, return as needed for this.

## 2019-08-02 NOTE — Assessment & Plan Note (Addendum)
Significant weakness to extension at the fourth and fifth digits at the MCP. Exam is for the most part unremarkable. It is possible she has a rupture of the extensor digiti minimi and extensor digitorum to the fourth finger. Fiberglass extension splint was fashioned today. X-rays of the hand and wrist, MRI of the hand and wrist, home health physical therapy. Return to see me in 1 month.  MRI reviewed and there does appear to be evidence of degenerative ruptures of the fourth common extensor and the extensor digiti minimi tendons, I am happy to refer her to a hand surgeon for discussion of surgical repair.

## 2019-08-05 DIAGNOSIS — F411 Generalized anxiety disorder: Secondary | ICD-10-CM | POA: Diagnosis not present

## 2019-08-05 DIAGNOSIS — F331 Major depressive disorder, recurrent, moderate: Secondary | ICD-10-CM | POA: Diagnosis not present

## 2019-08-06 DIAGNOSIS — Z87440 Personal history of urinary (tract) infections: Secondary | ICD-10-CM | POA: Diagnosis not present

## 2019-08-06 DIAGNOSIS — R8271 Bacteriuria: Secondary | ICD-10-CM | POA: Diagnosis not present

## 2019-08-09 ENCOUNTER — Other Ambulatory Visit: Payer: Medicare Other

## 2019-08-11 DIAGNOSIS — E875 Hyperkalemia: Secondary | ICD-10-CM | POA: Diagnosis not present

## 2019-08-11 DIAGNOSIS — E871 Hypo-osmolality and hyponatremia: Secondary | ICD-10-CM | POA: Diagnosis not present

## 2019-08-11 DIAGNOSIS — N390 Urinary tract infection, site not specified: Secondary | ICD-10-CM | POA: Diagnosis not present

## 2019-08-11 DIAGNOSIS — I10 Essential (primary) hypertension: Secondary | ICD-10-CM | POA: Diagnosis not present

## 2019-08-11 DIAGNOSIS — D649 Anemia, unspecified: Secondary | ICD-10-CM | POA: Insufficient documentation

## 2019-08-12 DIAGNOSIS — N390 Urinary tract infection, site not specified: Secondary | ICD-10-CM | POA: Diagnosis not present

## 2019-08-12 DIAGNOSIS — E871 Hypo-osmolality and hyponatremia: Secondary | ICD-10-CM | POA: Diagnosis not present

## 2019-08-12 DIAGNOSIS — F411 Generalized anxiety disorder: Secondary | ICD-10-CM | POA: Diagnosis not present

## 2019-08-12 DIAGNOSIS — D649 Anemia, unspecified: Secondary | ICD-10-CM | POA: Diagnosis not present

## 2019-08-12 DIAGNOSIS — E875 Hyperkalemia: Secondary | ICD-10-CM | POA: Diagnosis not present

## 2019-08-12 DIAGNOSIS — I10 Essential (primary) hypertension: Secondary | ICD-10-CM | POA: Diagnosis not present

## 2019-08-12 DIAGNOSIS — F331 Major depressive disorder, recurrent, moderate: Secondary | ICD-10-CM | POA: Diagnosis not present

## 2019-08-13 DIAGNOSIS — Z87440 Personal history of urinary (tract) infections: Secondary | ICD-10-CM | POA: Diagnosis not present

## 2019-08-13 DIAGNOSIS — F419 Anxiety disorder, unspecified: Secondary | ICD-10-CM | POA: Diagnosis not present

## 2019-08-13 DIAGNOSIS — I10 Essential (primary) hypertension: Secondary | ICD-10-CM | POA: Diagnosis not present

## 2019-08-13 DIAGNOSIS — K5790 Diverticulosis of intestine, part unspecified, without perforation or abscess without bleeding: Secondary | ICD-10-CM | POA: Diagnosis not present

## 2019-08-13 DIAGNOSIS — M25841 Other specified joint disorders, right hand: Secondary | ICD-10-CM | POA: Diagnosis not present

## 2019-08-13 DIAGNOSIS — M130 Polyarthritis, unspecified: Secondary | ICD-10-CM | POA: Diagnosis not present

## 2019-08-13 DIAGNOSIS — M810 Age-related osteoporosis without current pathological fracture: Secondary | ICD-10-CM | POA: Diagnosis not present

## 2019-08-13 DIAGNOSIS — M48061 Spinal stenosis, lumbar region without neurogenic claudication: Secondary | ICD-10-CM | POA: Diagnosis not present

## 2019-08-13 DIAGNOSIS — I480 Paroxysmal atrial fibrillation: Secondary | ICD-10-CM | POA: Diagnosis not present

## 2019-08-13 DIAGNOSIS — I340 Nonrheumatic mitral (valve) insufficiency: Secondary | ICD-10-CM | POA: Diagnosis not present

## 2019-08-13 DIAGNOSIS — Z87891 Personal history of nicotine dependence: Secondary | ICD-10-CM | POA: Diagnosis not present

## 2019-08-13 DIAGNOSIS — F329 Major depressive disorder, single episode, unspecified: Secondary | ICD-10-CM | POA: Diagnosis not present

## 2019-08-13 DIAGNOSIS — D649 Anemia, unspecified: Secondary | ICD-10-CM | POA: Diagnosis not present

## 2019-08-16 ENCOUNTER — Other Ambulatory Visit: Payer: Self-pay

## 2019-08-16 ENCOUNTER — Ambulatory Visit (INDEPENDENT_AMBULATORY_CARE_PROVIDER_SITE_OTHER): Payer: Medicare Other

## 2019-08-16 DIAGNOSIS — R29898 Other symptoms and signs involving the musculoskeletal system: Secondary | ICD-10-CM

## 2019-08-16 DIAGNOSIS — M6289 Other specified disorders of muscle: Secondary | ICD-10-CM | POA: Diagnosis not present

## 2019-08-16 DIAGNOSIS — M19041 Primary osteoarthritis, right hand: Secondary | ICD-10-CM | POA: Diagnosis not present

## 2019-08-16 DIAGNOSIS — R600 Localized edema: Secondary | ICD-10-CM | POA: Diagnosis not present

## 2019-08-17 NOTE — Addendum Note (Signed)
Addended by: Silverio Decamp on: 08/17/2019 11:32 AM   Modules accepted: Orders

## 2019-08-18 DIAGNOSIS — I480 Paroxysmal atrial fibrillation: Secondary | ICD-10-CM | POA: Diagnosis not present

## 2019-08-18 DIAGNOSIS — M25841 Other specified joint disorders, right hand: Secondary | ICD-10-CM | POA: Diagnosis not present

## 2019-08-18 DIAGNOSIS — M130 Polyarthritis, unspecified: Secondary | ICD-10-CM | POA: Diagnosis not present

## 2019-08-18 DIAGNOSIS — I10 Essential (primary) hypertension: Secondary | ICD-10-CM | POA: Diagnosis not present

## 2019-08-18 DIAGNOSIS — F419 Anxiety disorder, unspecified: Secondary | ICD-10-CM | POA: Diagnosis not present

## 2019-08-18 DIAGNOSIS — D649 Anemia, unspecified: Secondary | ICD-10-CM | POA: Diagnosis not present

## 2019-08-19 DIAGNOSIS — I519 Heart disease, unspecified: Secondary | ICD-10-CM | POA: Insufficient documentation

## 2019-08-20 DIAGNOSIS — N179 Acute kidney failure, unspecified: Secondary | ICD-10-CM | POA: Diagnosis not present

## 2019-08-20 DIAGNOSIS — M79641 Pain in right hand: Secondary | ICD-10-CM | POA: Diagnosis not present

## 2019-08-20 DIAGNOSIS — E871 Hypo-osmolality and hyponatremia: Secondary | ICD-10-CM | POA: Diagnosis not present

## 2019-08-20 DIAGNOSIS — E875 Hyperkalemia: Secondary | ICD-10-CM | POA: Diagnosis not present

## 2019-08-24 DIAGNOSIS — M130 Polyarthritis, unspecified: Secondary | ICD-10-CM | POA: Diagnosis not present

## 2019-08-24 DIAGNOSIS — I480 Paroxysmal atrial fibrillation: Secondary | ICD-10-CM | POA: Diagnosis not present

## 2019-08-24 DIAGNOSIS — D649 Anemia, unspecified: Secondary | ICD-10-CM | POA: Diagnosis not present

## 2019-08-24 DIAGNOSIS — F419 Anxiety disorder, unspecified: Secondary | ICD-10-CM | POA: Diagnosis not present

## 2019-08-24 DIAGNOSIS — M25841 Other specified joint disorders, right hand: Secondary | ICD-10-CM | POA: Diagnosis not present

## 2019-08-24 DIAGNOSIS — I10 Essential (primary) hypertension: Secondary | ICD-10-CM | POA: Diagnosis not present

## 2019-08-26 DIAGNOSIS — F331 Major depressive disorder, recurrent, moderate: Secondary | ICD-10-CM | POA: Diagnosis not present

## 2019-08-26 DIAGNOSIS — F411 Generalized anxiety disorder: Secondary | ICD-10-CM | POA: Diagnosis not present

## 2019-08-30 ENCOUNTER — Other Ambulatory Visit: Payer: Self-pay

## 2019-08-30 ENCOUNTER — Ambulatory Visit (INDEPENDENT_AMBULATORY_CARE_PROVIDER_SITE_OTHER): Payer: Medicare Other | Admitting: Sports Medicine

## 2019-08-30 ENCOUNTER — Encounter: Payer: Self-pay | Admitting: Sports Medicine

## 2019-08-30 DIAGNOSIS — R29898 Other symptoms and signs involving the musculoskeletal system: Secondary | ICD-10-CM | POA: Diagnosis not present

## 2019-08-30 NOTE — Assessment & Plan Note (Signed)
Saw hand surgeon who recommended buddy taping in the hopes of having this heal. She now feels as though she could live with it, because of this I do not suggest any surgical intervention.  Return as needed.

## 2019-08-30 NOTE — Progress Notes (Signed)
Subjective:    CC: Follow-up  HPI: This is a pleasant 85 year old female, she returns to discuss her hand problem, she had great difficulty extending her fourth and fifth fingers, ultimately an MRI showed degenerative ruptures of the extensor digiti minimi and the fourth extensor tendon.  I referred her to hand surgery who simply recommended buddy taping.  She is overall doing okay with buddy taping, she understands that operative repair is a long and painful process of recovery, and she feels as though she can live with it.  I reviewed the past medical history, family history, social history, surgical history, and allergies today and no changes were needed.  Please see the problem list section below in epic for further details.  Past Medical History: Past Medical History:  Diagnosis Date  . Anemia    low iron  . Anxiety   . Arthritis   . Depression   . Diverticulitis   . Frequent urinary tract infections   . GERD (gastroesophageal reflux disease)   . Hypertension   . Mitral regurgitation   . Osteoporosis   . PAF (paroxysmal atrial fibrillation) ([place])    03/2017 admitted to [facility] with afib with RVR-->SR, discharged on diltiazem (had drop in H/H on anticoag, so d/c'd)  . PONV (postoperative nausea and vomiting)    Past Surgical History: Past Surgical History:  Procedure Laterality Date  . ABDOMINAL HYSTERECTOMY    . APPENDECTOMY    . CHOLECYSTECTOMY    . COLONOSCOPY    . HERNIA REPAIR     abdominal hernia  . KNEE ARTHROSCOPY W/ MENISCAL REPAIR Right   . KYPHOPLASTY    . LUMBAR LAMINECTOMY/DECOMPRESSION MICRODISCECTOMY Left 04/09/2018   Procedure: LAMINECTOMY FOR FACET/SYNOVIAL CYST LUMBAR FOUR   - LUMBAR FIVE , LUMBAR FIVE  - SACRAL ONE  LEFT;  Surgeon: Ashok Pall, MD;  Location: [place];  Service: Neurosurgery;  Laterality: Left;   Social History: Social History   Socioeconomic History  . Marital status: Widowed    Spouse name: Not on file  . Number of children:  Not on file  . Years of education: Not on file  . Highest education level: Not on file  Occupational History  . Not on file  Social Needs  . Financial resource strain: Not on file  . Food insecurity    Worry: Not on file    Inability: Not on file  . Transportation needs    Medical: Not on file    Non-medical: Not on file  Tobacco Use  . Smoking status: Former Research scientist (life sciences)  . Smokeless tobacco: Never Used  Substance and Sexual Activity  . Alcohol use: No  . Drug use: No  . Sexual activity: Never  Lifestyle  . Physical activity    Days per week: Not on file    Minutes per session: Not on file  . Stress: Not on file  Relationships  . Social Herbalist on phone: Not on file    Gets together: Not on file    Attends religious service: Not on file    Active member of club or organization: Not on file    Attends meetings of clubs or organizations: Not on file    Relationship status: Not on file  Other Topics Concern  . Not on file  Social History Narrative  . Not on file   Family History: No family history on file. Allergies: No Known Allergies Medications: See med rec.  Review of Systems: No fevers, chills,  night sweats, weight loss, chest pain, or shortness of breath.   Objective:    General: Well Developed, well nourished, and in no acute distress.  Neuro: Alert and oriented x3, extra-ocular muscles intact, sensation grossly intact.  HEENT: Normocephalic, atraumatic, pupils equal round reactive to light, neck supple, no masses, no lymphadenopathy, thyroid nonpalpable.  Skin: Warm and dry, no rashes. Cardiac: Regular rate and rhythm, no murmurs rubs or gallops, no lower extremity edema.  Respiratory: Clear to auscultation bilaterally. Not using accessory muscles, speaking in full sentences. Right hand: Fourth and third fingers are buddy taped, she has 0 extension at the MCP of the fifth digit.  She has 0 strength of extension of the fourth digit at the MCP as well.   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 She now feels as though she could live with it, because of this I do not suggest any surgical intervention.  Return as needed.   ___________________________________________ Gwen Her. Dianah Field, M.D., ABFM., CAQSM. Primary Care and Sports Medicine [facility]  Adjunct Professor of [place] [facility] [employer]

## 2019-08-31 ENCOUNTER — Telehealth: Payer: Self-pay | Admitting: *Deleted

## 2019-08-31 DIAGNOSIS — M130 Polyarthritis, unspecified: Secondary | ICD-10-CM | POA: Diagnosis not present

## 2019-08-31 DIAGNOSIS — F419 Anxiety disorder, unspecified: Secondary | ICD-10-CM | POA: Diagnosis not present

## 2019-08-31 DIAGNOSIS — M25841 Other specified joint disorders, right hand: Secondary | ICD-10-CM | POA: Diagnosis not present

## 2019-08-31 DIAGNOSIS — I10 Essential (primary) hypertension: Secondary | ICD-10-CM | POA: Diagnosis not present

## 2019-08-31 DIAGNOSIS — D649 Anemia, unspecified: Secondary | ICD-10-CM | POA: Diagnosis not present

## 2019-08-31 DIAGNOSIS — I480 Paroxysmal atrial fibrillation: Secondary | ICD-10-CM | POA: Diagnosis not present

## 2019-08-31 NOTE — Telephone Encounter (Signed)
Verbal order given  

## 2019-08-31 NOTE — Telephone Encounter (Signed)
Deborah Robbins, PT from [facility] would like a VO to continue PT once a week for 2 weeks then twice a week for 2 weeks.

## 2019-09-01 NOTE — Telephone Encounter (Signed)
Left brief VM for Verbal Orders per Dr. Darene Lamer recommendations. She was asked to call back with any questions.

## 2019-09-02 DIAGNOSIS — M130 Polyarthritis, unspecified: Secondary | ICD-10-CM | POA: Diagnosis not present

## 2019-09-02 DIAGNOSIS — M25841 Other specified joint disorders, right hand: Secondary | ICD-10-CM | POA: Diagnosis not present

## 2019-09-02 DIAGNOSIS — D649 Anemia, unspecified: Secondary | ICD-10-CM | POA: Diagnosis not present

## 2019-09-02 DIAGNOSIS — I10 Essential (primary) hypertension: Secondary | ICD-10-CM | POA: Diagnosis not present

## 2019-09-02 DIAGNOSIS — I480 Paroxysmal atrial fibrillation: Secondary | ICD-10-CM | POA: Diagnosis not present

## 2019-09-02 DIAGNOSIS — F419 Anxiety disorder, unspecified: Secondary | ICD-10-CM | POA: Diagnosis not present

## 2019-09-02 DIAGNOSIS — F331 Major depressive disorder, recurrent, moderate: Secondary | ICD-10-CM | POA: Diagnosis not present

## 2019-09-02 DIAGNOSIS — F411 Generalized anxiety disorder: Secondary | ICD-10-CM | POA: Diagnosis not present

## 2019-09-08 DIAGNOSIS — N179 Acute kidney failure, unspecified: Secondary | ICD-10-CM | POA: Diagnosis not present

## 2019-09-08 DIAGNOSIS — I10 Essential (primary) hypertension: Secondary | ICD-10-CM | POA: Diagnosis not present

## 2019-09-08 DIAGNOSIS — D649 Anemia, unspecified: Secondary | ICD-10-CM | POA: Diagnosis not present

## 2019-09-08 DIAGNOSIS — E875 Hyperkalemia: Secondary | ICD-10-CM | POA: Diagnosis not present

## 2019-09-08 DIAGNOSIS — E871 Hypo-osmolality and hyponatremia: Secondary | ICD-10-CM | POA: Diagnosis not present

## 2019-09-08 DIAGNOSIS — F419 Anxiety disorder, unspecified: Secondary | ICD-10-CM | POA: Diagnosis not present

## 2019-09-08 DIAGNOSIS — M130 Polyarthritis, unspecified: Secondary | ICD-10-CM | POA: Diagnosis not present

## 2019-09-08 DIAGNOSIS — N390 Urinary tract infection, site not specified: Secondary | ICD-10-CM | POA: Diagnosis not present

## 2019-09-08 DIAGNOSIS — I480 Paroxysmal atrial fibrillation: Secondary | ICD-10-CM | POA: Diagnosis not present

## 2019-09-08 DIAGNOSIS — M25841 Other specified joint disorders, right hand: Secondary | ICD-10-CM | POA: Diagnosis not present

## 2019-09-09 DIAGNOSIS — F331 Major depressive disorder, recurrent, moderate: Secondary | ICD-10-CM | POA: Diagnosis not present

## 2019-09-09 DIAGNOSIS — F411 Generalized anxiety disorder: Secondary | ICD-10-CM | POA: Diagnosis not present

## 2019-09-12 DIAGNOSIS — M25841 Other specified joint disorders, right hand: Secondary | ICD-10-CM | POA: Diagnosis not present

## 2019-09-12 DIAGNOSIS — I480 Paroxysmal atrial fibrillation: Secondary | ICD-10-CM | POA: Diagnosis not present

## 2019-09-12 DIAGNOSIS — M810 Age-related osteoporosis without current pathological fracture: Secondary | ICD-10-CM | POA: Diagnosis not present

## 2019-09-12 DIAGNOSIS — Z87440 Personal history of urinary (tract) infections: Secondary | ICD-10-CM | POA: Diagnosis not present

## 2019-09-12 DIAGNOSIS — M130 Polyarthritis, unspecified: Secondary | ICD-10-CM | POA: Diagnosis not present

## 2019-09-12 DIAGNOSIS — I340 Nonrheumatic mitral (valve) insufficiency: Secondary | ICD-10-CM | POA: Diagnosis not present

## 2019-09-12 DIAGNOSIS — F329 Major depressive disorder, single episode, unspecified: Secondary | ICD-10-CM | POA: Diagnosis not present

## 2019-09-12 DIAGNOSIS — D649 Anemia, unspecified: Secondary | ICD-10-CM | POA: Diagnosis not present

## 2019-09-12 DIAGNOSIS — Z87891 Personal history of nicotine dependence: Secondary | ICD-10-CM | POA: Diagnosis not present

## 2019-09-12 DIAGNOSIS — M48061 Spinal stenosis, lumbar region without neurogenic claudication: Secondary | ICD-10-CM | POA: Diagnosis not present

## 2019-09-12 DIAGNOSIS — I10 Essential (primary) hypertension: Secondary | ICD-10-CM | POA: Diagnosis not present

## 2019-09-12 DIAGNOSIS — K5790 Diverticulosis of intestine, part unspecified, without perforation or abscess without bleeding: Secondary | ICD-10-CM | POA: Diagnosis not present

## 2019-09-12 DIAGNOSIS — F419 Anxiety disorder, unspecified: Secondary | ICD-10-CM | POA: Diagnosis not present

## 2019-09-13 DIAGNOSIS — D649 Anemia, unspecified: Secondary | ICD-10-CM | POA: Diagnosis not present

## 2019-09-13 DIAGNOSIS — I10 Essential (primary) hypertension: Secondary | ICD-10-CM | POA: Diagnosis not present

## 2019-09-13 DIAGNOSIS — I480 Paroxysmal atrial fibrillation: Secondary | ICD-10-CM | POA: Diagnosis not present

## 2019-09-13 DIAGNOSIS — M25841 Other specified joint disorders, right hand: Secondary | ICD-10-CM | POA: Diagnosis not present

## 2019-09-13 DIAGNOSIS — F419 Anxiety disorder, unspecified: Secondary | ICD-10-CM | POA: Diagnosis not present

## 2019-09-13 DIAGNOSIS — M130 Polyarthritis, unspecified: Secondary | ICD-10-CM | POA: Diagnosis not present

## 2019-09-14 DIAGNOSIS — I10 Essential (primary) hypertension: Secondary | ICD-10-CM | POA: Diagnosis not present

## 2019-09-14 DIAGNOSIS — M25841 Other specified joint disorders, right hand: Secondary | ICD-10-CM | POA: Diagnosis not present

## 2019-09-14 DIAGNOSIS — F419 Anxiety disorder, unspecified: Secondary | ICD-10-CM | POA: Diagnosis not present

## 2019-09-14 DIAGNOSIS — D649 Anemia, unspecified: Secondary | ICD-10-CM | POA: Diagnosis not present

## 2019-09-14 DIAGNOSIS — I480 Paroxysmal atrial fibrillation: Secondary | ICD-10-CM | POA: Diagnosis not present

## 2019-09-14 DIAGNOSIS — M130 Polyarthritis, unspecified: Secondary | ICD-10-CM | POA: Diagnosis not present

## 2019-09-16 DIAGNOSIS — F331 Major depressive disorder, recurrent, moderate: Secondary | ICD-10-CM | POA: Diagnosis not present

## 2019-09-16 DIAGNOSIS — F411 Generalized anxiety disorder: Secondary | ICD-10-CM | POA: Diagnosis not present

## 2019-09-17 DIAGNOSIS — I480 Paroxysmal atrial fibrillation: Secondary | ICD-10-CM | POA: Diagnosis not present

## 2019-09-17 DIAGNOSIS — D649 Anemia, unspecified: Secondary | ICD-10-CM | POA: Diagnosis not present

## 2019-09-17 DIAGNOSIS — M25841 Other specified joint disorders, right hand: Secondary | ICD-10-CM | POA: Diagnosis not present

## 2019-09-17 DIAGNOSIS — M130 Polyarthritis, unspecified: Secondary | ICD-10-CM | POA: Diagnosis not present

## 2019-09-17 DIAGNOSIS — F419 Anxiety disorder, unspecified: Secondary | ICD-10-CM | POA: Diagnosis not present

## 2019-09-17 DIAGNOSIS — I10 Essential (primary) hypertension: Secondary | ICD-10-CM | POA: Diagnosis not present

## 2019-09-21 DIAGNOSIS — I480 Paroxysmal atrial fibrillation: Secondary | ICD-10-CM | POA: Diagnosis not present

## 2019-09-21 DIAGNOSIS — M25841 Other specified joint disorders, right hand: Secondary | ICD-10-CM | POA: Diagnosis not present

## 2019-09-21 DIAGNOSIS — D649 Anemia, unspecified: Secondary | ICD-10-CM | POA: Diagnosis not present

## 2019-09-21 DIAGNOSIS — M130 Polyarthritis, unspecified: Secondary | ICD-10-CM | POA: Diagnosis not present

## 2019-09-21 DIAGNOSIS — I10 Essential (primary) hypertension: Secondary | ICD-10-CM | POA: Diagnosis not present

## 2019-09-21 DIAGNOSIS — F419 Anxiety disorder, unspecified: Secondary | ICD-10-CM | POA: Diagnosis not present

## 2019-09-22 DIAGNOSIS — M48062 Spinal stenosis, lumbar region with neurogenic claudication: Secondary | ICD-10-CM | POA: Diagnosis not present

## 2019-09-22 DIAGNOSIS — M7138 Other bursal cyst, other site: Secondary | ICD-10-CM | POA: Diagnosis not present

## 2019-09-22 DIAGNOSIS — M5416 Radiculopathy, lumbar region: Secondary | ICD-10-CM | POA: Diagnosis not present

## 2019-09-22 DIAGNOSIS — G8929 Other chronic pain: Secondary | ICD-10-CM | POA: Diagnosis not present

## 2019-09-22 DIAGNOSIS — M5136 Other intervertebral disc degeneration, lumbar region: Secondary | ICD-10-CM | POA: Diagnosis not present

## 2019-09-22 DIAGNOSIS — M5441 Lumbago with sciatica, right side: Secondary | ICD-10-CM | POA: Diagnosis not present

## 2019-09-23 DIAGNOSIS — F411 Generalized anxiety disorder: Secondary | ICD-10-CM | POA: Diagnosis not present

## 2019-09-23 DIAGNOSIS — F331 Major depressive disorder, recurrent, moderate: Secondary | ICD-10-CM | POA: Diagnosis not present

## 2019-09-24 DIAGNOSIS — I10 Essential (primary) hypertension: Secondary | ICD-10-CM | POA: Diagnosis not present

## 2019-09-24 DIAGNOSIS — I480 Paroxysmal atrial fibrillation: Secondary | ICD-10-CM | POA: Diagnosis not present

## 2019-09-24 DIAGNOSIS — M25841 Other specified joint disorders, right hand: Secondary | ICD-10-CM | POA: Diagnosis not present

## 2019-09-24 DIAGNOSIS — F419 Anxiety disorder, unspecified: Secondary | ICD-10-CM | POA: Diagnosis not present

## 2019-09-24 DIAGNOSIS — M130 Polyarthritis, unspecified: Secondary | ICD-10-CM | POA: Diagnosis not present

## 2019-09-24 DIAGNOSIS — D649 Anemia, unspecified: Secondary | ICD-10-CM | POA: Diagnosis not present

## 2019-09-28 DIAGNOSIS — D649 Anemia, unspecified: Secondary | ICD-10-CM | POA: Diagnosis not present

## 2019-09-28 DIAGNOSIS — F419 Anxiety disorder, unspecified: Secondary | ICD-10-CM | POA: Diagnosis not present

## 2019-09-28 DIAGNOSIS — M130 Polyarthritis, unspecified: Secondary | ICD-10-CM | POA: Diagnosis not present

## 2019-09-28 DIAGNOSIS — M25841 Other specified joint disorders, right hand: Secondary | ICD-10-CM | POA: Diagnosis not present

## 2019-09-28 DIAGNOSIS — I10 Essential (primary) hypertension: Secondary | ICD-10-CM | POA: Diagnosis not present

## 2019-09-28 DIAGNOSIS — I480 Paroxysmal atrial fibrillation: Secondary | ICD-10-CM | POA: Diagnosis not present

## 2019-09-30 DIAGNOSIS — F331 Major depressive disorder, recurrent, moderate: Secondary | ICD-10-CM | POA: Diagnosis not present

## 2019-09-30 DIAGNOSIS — F411 Generalized anxiety disorder: Secondary | ICD-10-CM | POA: Diagnosis not present

## 2019-10-07 DIAGNOSIS — F411 Generalized anxiety disorder: Secondary | ICD-10-CM | POA: Diagnosis not present

## 2019-10-07 DIAGNOSIS — F331 Major depressive disorder, recurrent, moderate: Secondary | ICD-10-CM | POA: Diagnosis not present

## 2019-10-13 DIAGNOSIS — M533 Sacrococcygeal disorders, not elsewhere classified: Secondary | ICD-10-CM | POA: Diagnosis not present

## 2019-10-13 DIAGNOSIS — M5441 Lumbago with sciatica, right side: Secondary | ICD-10-CM | POA: Diagnosis not present

## 2019-10-13 DIAGNOSIS — Z79899 Other long term (current) drug therapy: Secondary | ICD-10-CM | POA: Diagnosis not present

## 2019-10-13 DIAGNOSIS — M7138 Other bursal cyst, other site: Secondary | ICD-10-CM | POA: Diagnosis not present

## 2019-10-13 DIAGNOSIS — M5416 Radiculopathy, lumbar region: Secondary | ICD-10-CM | POA: Diagnosis not present

## 2019-10-13 DIAGNOSIS — Z5181 Encounter for therapeutic drug level monitoring: Secondary | ICD-10-CM | POA: Diagnosis not present

## 2019-10-14 DIAGNOSIS — M5416 Radiculopathy, lumbar region: Secondary | ICD-10-CM | POA: Diagnosis not present

## 2019-10-21 DIAGNOSIS — F411 Generalized anxiety disorder: Secondary | ICD-10-CM | POA: Diagnosis not present

## 2019-10-21 DIAGNOSIS — M533 Sacrococcygeal disorders, not elsewhere classified: Secondary | ICD-10-CM | POA: Diagnosis not present

## 2019-10-21 DIAGNOSIS — F331 Major depressive disorder, recurrent, moderate: Secondary | ICD-10-CM | POA: Diagnosis not present

## 2019-10-26 DIAGNOSIS — Z23 Encounter for immunization: Secondary | ICD-10-CM | POA: Diagnosis not present

## 2019-10-28 DIAGNOSIS — F331 Major depressive disorder, recurrent, moderate: Secondary | ICD-10-CM | POA: Diagnosis not present

## 2019-10-28 DIAGNOSIS — F411 Generalized anxiety disorder: Secondary | ICD-10-CM | POA: Diagnosis not present

## 2019-11-04 DIAGNOSIS — F331 Major depressive disorder, recurrent, moderate: Secondary | ICD-10-CM | POA: Diagnosis not present

## 2019-11-04 DIAGNOSIS — M533 Sacrococcygeal disorders, not elsewhere classified: Secondary | ICD-10-CM | POA: Diagnosis not present

## 2019-11-04 DIAGNOSIS — M5441 Lumbago with sciatica, right side: Secondary | ICD-10-CM | POA: Diagnosis not present

## 2019-11-04 DIAGNOSIS — M5416 Radiculopathy, lumbar region: Secondary | ICD-10-CM | POA: Diagnosis not present

## 2019-11-04 DIAGNOSIS — M7138 Other bursal cyst, other site: Secondary | ICD-10-CM | POA: Diagnosis not present

## 2019-11-04 DIAGNOSIS — F411 Generalized anxiety disorder: Secondary | ICD-10-CM | POA: Diagnosis not present

## 2019-11-18 DIAGNOSIS — F411 Generalized anxiety disorder: Secondary | ICD-10-CM | POA: Diagnosis not present

## 2019-11-18 DIAGNOSIS — F331 Major depressive disorder, recurrent, moderate: Secondary | ICD-10-CM | POA: Diagnosis not present

## 2019-12-01 DIAGNOSIS — R109 Unspecified abdominal pain: Secondary | ICD-10-CM | POA: Diagnosis not present

## 2019-12-01 DIAGNOSIS — K5904 Chronic idiopathic constipation: Secondary | ICD-10-CM | POA: Diagnosis not present

## 2019-12-01 DIAGNOSIS — R238 Other skin changes: Secondary | ICD-10-CM | POA: Diagnosis not present

## 2019-12-01 DIAGNOSIS — R791 Abnormal coagulation profile: Secondary | ICD-10-CM | POA: Diagnosis not present

## 2019-12-01 DIAGNOSIS — R8271 Bacteriuria: Secondary | ICD-10-CM | POA: Diagnosis not present

## 2019-12-01 DIAGNOSIS — S81801A Unspecified open wound, right lower leg, initial encounter: Secondary | ICD-10-CM | POA: Diagnosis not present

## 2019-12-01 DIAGNOSIS — Z87440 Personal history of urinary (tract) infections: Secondary | ICD-10-CM | POA: Diagnosis not present

## 2019-12-02 DIAGNOSIS — S2232XA Fracture of one rib, left side, initial encounter for closed fracture: Secondary | ICD-10-CM | POA: Diagnosis not present

## 2019-12-02 DIAGNOSIS — F331 Major depressive disorder, recurrent, moderate: Secondary | ICD-10-CM | POA: Diagnosis not present

## 2019-12-02 DIAGNOSIS — M5441 Lumbago with sciatica, right side: Secondary | ICD-10-CM | POA: Diagnosis not present

## 2019-12-02 DIAGNOSIS — M5416 Radiculopathy, lumbar region: Secondary | ICD-10-CM | POA: Diagnosis not present

## 2019-12-02 DIAGNOSIS — M533 Sacrococcygeal disorders, not elsewhere classified: Secondary | ICD-10-CM | POA: Diagnosis not present

## 2019-12-02 DIAGNOSIS — M7138 Other bursal cyst, other site: Secondary | ICD-10-CM | POA: Diagnosis not present

## 2019-12-02 DIAGNOSIS — F411 Generalized anxiety disorder: Secondary | ICD-10-CM | POA: Diagnosis not present

## 2019-12-09 DIAGNOSIS — F331 Major depressive disorder, recurrent, moderate: Secondary | ICD-10-CM | POA: Diagnosis not present

## 2019-12-09 DIAGNOSIS — F411 Generalized anxiety disorder: Secondary | ICD-10-CM | POA: Diagnosis not present

## 2019-12-13 DIAGNOSIS — E875 Hyperkalemia: Secondary | ICD-10-CM | POA: Diagnosis not present

## 2019-12-13 DIAGNOSIS — E871 Hypo-osmolality and hyponatremia: Secondary | ICD-10-CM | POA: Diagnosis not present

## 2019-12-13 DIAGNOSIS — N390 Urinary tract infection, site not specified: Secondary | ICD-10-CM | POA: Diagnosis not present

## 2019-12-13 DIAGNOSIS — I10 Essential (primary) hypertension: Secondary | ICD-10-CM | POA: Diagnosis not present

## 2019-12-25 ENCOUNTER — Emergency Department (INDEPENDENT_AMBULATORY_CARE_PROVIDER_SITE_OTHER)
Admission: EM | Admit: 2019-12-25 | Discharge: 2019-12-25 | Disposition: A | Discharge status: Home / Self Care | Payer: Medicare Other | Source: Home / Self Care

## 2019-12-25 ENCOUNTER — Encounter: Payer: Self-pay | Admitting: Emergency Medicine

## 2019-12-25 ENCOUNTER — Other Ambulatory Visit: Payer: Self-pay

## 2019-12-25 DIAGNOSIS — Z23 Encounter for immunization: Secondary | ICD-10-CM | POA: Diagnosis not present

## 2019-12-25 DIAGNOSIS — R600 Localized edema: Secondary | ICD-10-CM

## 2019-12-25 DIAGNOSIS — T148XXD Other injury of unspecified body region, subsequent encounter: Secondary | ICD-10-CM | POA: Diagnosis not present

## 2019-12-25 MED ORDER — TETANUS-DIPHTH-ACELL PERTUSSIS 5-2.5-18.5 LF-MCG/0.5 IM SUSP
0.5000 mL | Freq: Once | INTRAMUSCULAR | Status: AC
  Administered 2019-12-25: 13:00:00 0.5 mL via INTRAMUSCULAR

## 2019-12-25 MED ORDER — CEFTRIAXONE SODIUM 500 MG IJ SOLR
500.0000 mg | Freq: Once | INTRAMUSCULAR | Status: AC
  Administered 2019-12-25: 13:00:00 500 mg via INTRAMUSCULAR

## 2019-12-25 NOTE — ED Triage Notes (Signed)
Pt has had a lower left leg wound since October. She is supposed to see a wound specialist next week but her PCP wants her to be evaluated before then. She is currently taking abx. Wound is mild pain and draining.

## 2019-12-25 NOTE — ED Provider Notes (Signed)
[facility]    CSN: [id number] Arrival date & time: 12/25/19  1113      History   Chief Complaint Chief Complaint  Patient presents with  . Wound Infection    HPI Deborah Robbins is a 85 y.o. female.    HPI Deborah Robbins presents for evaluation of non-healing wound. Patient injured her lower left leg after hitting it against an object that was protruding from her walker. The object caused a puncture wound which has been present since 10/30/19. Patient is not a diabetic and has no documented history of PAD. She is not prescribed anticoagulant , although has a hx of Afib and mitral regurgitation . Last TDAP , 4 years ago. Has seen PCP for wound care managemnt and is current completing a second course of 2 week antibiotic therapy with prescribed Augmentin. Per care everywhere, patient saw PCP 12/20/19, was given 1 gram Rocephin, wound culture collected, and Augmentin prescribed. Unable to view the results of the wound culture, although daughter reports receiving a call from PCP office on 12/24 indicating wound culture did not grow out any bacteria. Daughter grew concern about wound appearance and was advised by PCP to be seen here at urgent care today. Patient endorses mild tenderness at the wound site and wound edges. She is cleansing the wound with alcohol and warm soaks of epsom salt. She is applying prescribed Mupirocin ointment. Denies chills, fever,nausea, or  vomiting. Endorses good sensation of lower extremity. Lower left ankle and leg mildly edematous. She is elevating at home and not wearing socks at home. Past Medical History:  Diagnosis Date  . Anemia    low iron  . Anxiety   . Arthritis   . Depression   . Diverticulitis   . Frequent urinary tract infections   . GERD (gastroesophageal reflux disease)   . Hypertension   . Mitral regurgitation   . Osteoporosis   . PAF (paroxysmal atrial fibrillation) ([place])    03/2017 admitted to [facility] with afib with  RVR-->SR, discharged on diltiazem (had drop in H/H on anticoag, so d/c'd)  . PONV (postoperative nausea and vomiting)     Patient Active Problem List   Diagnosis Date Noted  . 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 Injury of left foot 06/14/2019  . Recurrent urinary tract infection 08/13/2018  . Lumbar spinal stenosis 08/11/2017  . Normocytic anemia 08/11/2017    Past Surgical History:  Procedure Laterality Date  . ABDOMINAL HYSTERECTOMY    . APPENDECTOMY    . CHOLECYSTECTOMY    . COLONOSCOPY    . HERNIA REPAIR     abdominal hernia  . KNEE ARTHROSCOPY W/ MENISCAL REPAIR Right   . KYPHOPLASTY    . LUMBAR LAMINECTOMY/DECOMPRESSION MICRODISCECTOMY Left 04/09/2018   Procedure: LAMINECTOMY FOR FACET/SYNOVIAL CYST LUMBAR FOUR   - LUMBAR FIVE , LUMBAR FIVE  - SACRAL ONE  LEFT;  Surgeon: Ashok Pall, MD;  Location: [facility];  Service: Neurosurgery;  Laterality: Left;    OB History   No obstetric history on file.      Home Medications    Prior to Admission medications   Medication Sig Start Date End Date Taking? Authorizing Provider  amoxicillin-clavulanate (AUGMENTIN) 875-125 MG tablet Take by mouth. 12/20/19  Yes [provider]  doxycycline (MONODOX) 100 MG capsule Take by mouth. 12/24/19 03/23/20 Yes [provider]  mupirocin ointment (BACTROBAN) 2 % Apply topically. 12/21/19  Yes [provider]  nitrofurantoin (  MACRODANTIN) 100 MG capsule Take by mouth. 09/08/19  Yes [provider]  ALPRAZolam Duanne Moron) 0.5 MG tablet Take 0.5 mg by mouth 3 (three) times daily as needed for anxiety.     [provider]  Biotin 1000 MCG tablet Take 2,000 mcg by mouth daily.     [provider]  busPIRone (BUSPAR) 10 MG tablet Take 10 mg by mouth 2 (two) times daily.     [provider]  Cholecalciferol (VITAMIN D3) 2000 units capsule Take 2,000 Units by mouth daily.     [provider]  cyclobenzaprine (FLEXERIL) 10 MG tablet Take 1 tablet (10 mg total) by mouth 3 (three) times daily as needed for muscle spasms. 04/11/18   Costella, Vista Mink, PA-C  diclofenac (VOLTAREN) 75 MG EC tablet Take 1 tablet (75 mg total) by mouth 2 (two) times daily. 06/14/19 06/13/20  Silverio Decamp, MD  diltiazem (CARDIZEM CD) 120 MG 24 hr capsule Take 120 mg by mouth daily.     [provider]  estradiol (ESTRACE) 0.1 MG/GM vaginal cream Place 1 Applicatorful vaginally 3 (three) times a week.    [provider]  ferrous sulfate 325 (65 FE) MG tablet Take 325 mg by mouth 2 (two) times daily with a meal.  04/28/17   [provider]  gabapentin (NEURONTIN) 300 MG capsule Take 1 capsule (300 mg total) by mouth 3 (three) times daily. 05/19/19   Silverio Decamp, MD  HYDROcodone-acetaminophen (NORCO/VICODIN) 5-325 MG tablet Take 1 tablet by mouth every 8 (eight) hours as needed for moderate pain. 06/14/19   Silverio Decamp, MD  lisinopril (PRINIVIL,ZESTRIL) 5 MG tablet Take 5 mg by mouth daily.  04/20/17 02/11/19  [provider]  Multiple Vitamins-Minerals (PRESERVISION AREDS 2) CHEW Chew 1 tablet by mouth 2 (two) times daily.    [provider]  omeprazole (PRILOSEC) 20 MG capsule Take 20 mg by mouth daily.     [provider]  predniSONE (DELTASONE) 20 MG tablet Take 1 tablet (20 mg total) by mouth daily with breakfast. 02/22/19   Silverio Decamp, MD  traMADol (ULTRAM) 50 MG tablet TAKE 1 TABLET BY MOUTH EVERY 6 HOURS AS NEEDED FOR MODERATE PAIN . DO NOT EXCEED 6 PER 24 HOURS 09/17/18   Silverio Decamp, MD  vitamin B-12 (CYANOCOBALAMIN) 1000 MCG tablet Take 1,000 mcg by mouth daily.     [provider]    Family History History reviewed. No pertinent family history.  Social History Social History   Tobacco Use  . Smoking status: Former Research scientist (life sciences)  . Smokeless tobacco: Never Used  Substance Use  Topics  . Alcohol use: No  . Drug use: No     Allergies   Patient has no known allergies.   Review of Systems Review of Systems Pertinent negatives listed in HPI Physical Exam Triage Vital Signs ED Triage Vitals  Enc Vitals Group     BP --      Pulse --      Resp --      Temp --      Temp src --      SpO2 --      Weight 12/25/19 1209 139 lb (63 kg)     Height --      Head Circumference --      Peak Flow --      Pain Score 12/25/19 1208 2     Pain Loc --      Pain  Edu? --      Excl. in [place]? --    No data found.  Updated Vital Signs Wt 139 lb (63 kg)   BMI 26.26 kg/m   Visual Acuity Right Eye Distance:   Left Eye Distance:   Bilateral Distance:    Right Eye Near:   Left Eye Near:    Bilateral Near:     Physical Exam Constitutional:      Appearance: Normal appearance. She is not toxic-appearing.  Cardiovascular:     Rate and Rhythm: Normal rate.  Pulmonary:     Effort: Pulmonary effort is normal.     Breath sounds: Normal breath sounds.  Musculoskeletal:     Left lower leg: Swelling present. Edema present.       Legs:     Comments: Left lower leg wound, wound bed contains slough, non-odorous, surround skin mild erythema, prominent varicosities consistent with underlying PVD, absent of hair distribution   Neurological:     Mental Status: She is alert.  Psychiatric:        Mood and Affect: Mood normal.      UC Treatments / Results  Labs (all labs ordered are listed, but only abnormal results are displayed) Labs Reviewed - No data to display  EKG   Radiology No results found.  Procedures Procedures (including critical care time)  Medications Ordered in UC Medications - No data to display  Initial Impression / Assessment and Plan / UC Course  I have reviewed the triage vital signs and the nursing notes.  Pertinent labs & imaging results that were available during my care of the patient were reviewed by me and considered in my medical  decision making (see chart for details).   Wound Evaluation and Wound Care Completed. Prophylaxis treatment for wound infection completed with Rocephin 500 mg IM given. TDAP administered as patient last immunization > 4 years prior. The following  Discharge instructions given. Patient and family verbalized understanding and agreement with plan: Continue oral antibiotic. Cleanse wound with antibacterial soap and change dressing in 48 hours unless dressing becomes soiled. Request PCP order mepliex dressing (dressing applied that was applied to wound today) until patient is able to be seen by wound care.  I strongly recommend a wound care specialist follow-up given length of time wound has remained present. TDAP provided today. Second dose of Rocephin 500 mg administered today. If swelling, redness, or pain worsens , return here for follow-up. Continue oral antibiotics which were prescribed by PCP.  Final Clinical Impressions(s) / UC Diagnoses   Final diagnoses:  Wound infection     Discharge Instructions     Continue oral antibiotic. Cleanse wound with antibacterial soap and change dressing in 48 hours unless dressing becomes soiled. Request PCP order mepliex dressing (dressing applied that was applied to wound today) until patient is able to be seen by wound care.  I strongly recommend a wound care specialist follow-up given length of time wound has remained present. TDAP provided today. Second dose of Rocephin 500 mg administered today. If swelling, redness, or pain worsens , return here for follow-up. Continue oral antibiotics which were prescribed by PCP.     ED Prescriptions    None     PDMP not reviewed this encounter.   Scot Jun, FNP 12/26/19 1308

## 2019-12-25 NOTE — Discharge Instructions (Addendum)
Continue oral antibiotic. Cleanse wound with antibacterial soap and change dressing in 48 hours unless dressing becomes soiled. Request PCP order mepliex dressing (dressing applied that was applied to wound today) until patient is able to be seen by wound care.  I strongly recommend a wound care specialist follow-up given length of time wound has remained present. TDAP provided today. Second dose of Rocephin 500 mg administered today. If swelling, redness, or pain worsens , return here for follow-up. Continue oral antibiotics which were prescribed by PCP.

## 2020-01-05 DIAGNOSIS — K5900 Constipation, unspecified: Secondary | ICD-10-CM | POA: Diagnosis not present

## 2020-01-05 DIAGNOSIS — K644 Residual hemorrhoidal skin tags: Secondary | ICD-10-CM | POA: Insufficient documentation

## 2020-01-06 DIAGNOSIS — L089 Local infection of the skin and subcutaneous tissue, unspecified: Secondary | ICD-10-CM | POA: Diagnosis not present

## 2020-01-06 DIAGNOSIS — S81802D Unspecified open wound, left lower leg, subsequent encounter: Secondary | ICD-10-CM | POA: Diagnosis not present

## 2020-01-06 DIAGNOSIS — F331 Major depressive disorder, recurrent, moderate: Secondary | ICD-10-CM | POA: Diagnosis not present

## 2020-01-06 DIAGNOSIS — L97322 Non-pressure chronic ulcer of left ankle with fat layer exposed: Secondary | ICD-10-CM | POA: Diagnosis not present

## 2020-01-06 DIAGNOSIS — F411 Generalized anxiety disorder: Secondary | ICD-10-CM | POA: Diagnosis not present

## 2020-01-13 DIAGNOSIS — F331 Major depressive disorder, recurrent, moderate: Secondary | ICD-10-CM | POA: Diagnosis not present

## 2020-01-13 DIAGNOSIS — L97322 Non-pressure chronic ulcer of left ankle with fat layer exposed: Secondary | ICD-10-CM | POA: Diagnosis not present

## 2020-01-13 DIAGNOSIS — F411 Generalized anxiety disorder: Secondary | ICD-10-CM | POA: Diagnosis not present

## 2020-01-13 DIAGNOSIS — L0889 Other specified local infections of the skin and subcutaneous tissue: Secondary | ICD-10-CM | POA: Diagnosis not present

## 2020-01-13 DIAGNOSIS — L089 Local infection of the skin and subcutaneous tissue, unspecified: Secondary | ICD-10-CM | POA: Diagnosis not present

## 2020-01-17 DIAGNOSIS — F411 Generalized anxiety disorder: Secondary | ICD-10-CM | POA: Diagnosis not present

## 2020-01-17 DIAGNOSIS — F331 Major depressive disorder, recurrent, moderate: Secondary | ICD-10-CM | POA: Diagnosis not present

## 2020-01-20 DIAGNOSIS — F331 Major depressive disorder, recurrent, moderate: Secondary | ICD-10-CM | POA: Diagnosis not present

## 2020-01-20 DIAGNOSIS — F411 Generalized anxiety disorder: Secondary | ICD-10-CM | POA: Diagnosis not present

## 2020-01-24 DIAGNOSIS — L97322 Non-pressure chronic ulcer of left ankle with fat layer exposed: Secondary | ICD-10-CM | POA: Diagnosis not present

## 2020-01-24 DIAGNOSIS — L089 Local infection of the skin and subcutaneous tissue, unspecified: Secondary | ICD-10-CM | POA: Diagnosis not present

## 2020-01-27 DIAGNOSIS — F411 Generalized anxiety disorder: Secondary | ICD-10-CM | POA: Diagnosis not present

## 2020-01-27 DIAGNOSIS — F331 Major depressive disorder, recurrent, moderate: Secondary | ICD-10-CM | POA: Diagnosis not present

## 2020-02-03 DIAGNOSIS — I10 Essential (primary) hypertension: Secondary | ICD-10-CM | POA: Diagnosis not present

## 2020-02-03 DIAGNOSIS — F411 Generalized anxiety disorder: Secondary | ICD-10-CM | POA: Diagnosis not present

## 2020-02-03 DIAGNOSIS — I4891 Unspecified atrial fibrillation: Secondary | ICD-10-CM | POA: Diagnosis not present

## 2020-02-03 DIAGNOSIS — I480 Paroxysmal atrial fibrillation: Secondary | ICD-10-CM | POA: Diagnosis not present

## 2020-02-03 DIAGNOSIS — I4819 Other persistent atrial fibrillation: Secondary | ICD-10-CM | POA: Diagnosis not present

## 2020-02-03 DIAGNOSIS — F331 Major depressive disorder, recurrent, moderate: Secondary | ICD-10-CM | POA: Diagnosis not present

## 2020-02-07 DIAGNOSIS — S81802D Unspecified open wound, left lower leg, subsequent encounter: Secondary | ICD-10-CM | POA: Diagnosis not present

## 2020-02-07 DIAGNOSIS — L089 Local infection of the skin and subcutaneous tissue, unspecified: Secondary | ICD-10-CM | POA: Diagnosis not present

## 2020-02-07 DIAGNOSIS — L97322 Non-pressure chronic ulcer of left ankle with fat layer exposed: Secondary | ICD-10-CM | POA: Diagnosis not present

## 2020-02-10 DIAGNOSIS — F331 Major depressive disorder, recurrent, moderate: Secondary | ICD-10-CM | POA: Diagnosis not present

## 2020-02-10 DIAGNOSIS — F411 Generalized anxiety disorder: Secondary | ICD-10-CM | POA: Diagnosis not present

## 2020-02-17 DIAGNOSIS — F331 Major depressive disorder, recurrent, moderate: Secondary | ICD-10-CM | POA: Diagnosis not present

## 2020-02-17 DIAGNOSIS — F411 Generalized anxiety disorder: Secondary | ICD-10-CM | POA: Diagnosis not present

## 2020-02-21 DIAGNOSIS — L089 Local infection of the skin and subcutaneous tissue, unspecified: Secondary | ICD-10-CM | POA: Diagnosis not present

## 2020-02-21 DIAGNOSIS — L97322 Non-pressure chronic ulcer of left ankle with fat layer exposed: Secondary | ICD-10-CM | POA: Diagnosis not present

## 2020-02-21 DIAGNOSIS — L97822 Non-pressure chronic ulcer of other part of left lower leg with fat layer exposed: Secondary | ICD-10-CM | POA: Diagnosis not present

## 2020-02-24 DIAGNOSIS — F411 Generalized anxiety disorder: Secondary | ICD-10-CM | POA: Diagnosis not present

## 2020-02-24 DIAGNOSIS — F331 Major depressive disorder, recurrent, moderate: Secondary | ICD-10-CM | POA: Diagnosis not present

## 2020-03-02 DIAGNOSIS — F411 Generalized anxiety disorder: Secondary | ICD-10-CM | POA: Diagnosis not present

## 2020-03-02 DIAGNOSIS — F331 Major depressive disorder, recurrent, moderate: Secondary | ICD-10-CM | POA: Diagnosis not present

## 2020-03-08 DIAGNOSIS — Z23 Encounter for immunization: Secondary | ICD-10-CM | POA: Diagnosis not present

## 2020-03-09 DIAGNOSIS — F411 Generalized anxiety disorder: Secondary | ICD-10-CM | POA: Diagnosis not present

## 2020-03-09 DIAGNOSIS — F331 Major depressive disorder, recurrent, moderate: Secondary | ICD-10-CM | POA: Diagnosis not present

## 2020-03-13 DIAGNOSIS — S81802D Unspecified open wound, left lower leg, subsequent encounter: Secondary | ICD-10-CM | POA: Diagnosis not present

## 2020-03-13 DIAGNOSIS — L97222 Non-pressure chronic ulcer of left calf with fat layer exposed: Secondary | ICD-10-CM | POA: Diagnosis not present

## 2020-03-13 DIAGNOSIS — L97322 Non-pressure chronic ulcer of left ankle with fat layer exposed: Secondary | ICD-10-CM | POA: Diagnosis not present

## 2020-03-13 DIAGNOSIS — I83028 Varicose veins of left lower extremity with ulcer other part of lower leg: Secondary | ICD-10-CM | POA: Diagnosis not present

## 2020-03-13 DIAGNOSIS — L089 Local infection of the skin and subcutaneous tissue, unspecified: Secondary | ICD-10-CM | POA: Diagnosis not present

## 2020-03-15 DIAGNOSIS — F411 Generalized anxiety disorder: Secondary | ICD-10-CM | POA: Diagnosis not present

## 2020-03-15 DIAGNOSIS — F331 Major depressive disorder, recurrent, moderate: Secondary | ICD-10-CM | POA: Diagnosis not present

## 2020-03-16 DIAGNOSIS — M48061 Spinal stenosis, lumbar region without neurogenic claudication: Secondary | ICD-10-CM | POA: Insufficient documentation

## 2020-03-16 DIAGNOSIS — F331 Major depressive disorder, recurrent, moderate: Secondary | ICD-10-CM | POA: Diagnosis not present

## 2020-03-16 DIAGNOSIS — K219 Gastro-esophageal reflux disease without esophagitis: Secondary | ICD-10-CM | POA: Diagnosis not present

## 2020-03-16 DIAGNOSIS — Z9889 Other specified postprocedural states: Secondary | ICD-10-CM | POA: Insufficient documentation

## 2020-03-16 DIAGNOSIS — F411 Generalized anxiety disorder: Secondary | ICD-10-CM | POA: Insufficient documentation

## 2020-03-16 DIAGNOSIS — Z87440 Personal history of urinary (tract) infections: Secondary | ICD-10-CM | POA: Insufficient documentation

## 2020-03-16 DIAGNOSIS — M810 Age-related osteoporosis without current pathological fracture: Secondary | ICD-10-CM | POA: Diagnosis not present

## 2020-03-16 DIAGNOSIS — S81802D Unspecified open wound, left lower leg, subsequent encounter: Secondary | ICD-10-CM | POA: Diagnosis not present

## 2020-03-16 DIAGNOSIS — S81802A Unspecified open wound, left lower leg, initial encounter: Secondary | ICD-10-CM | POA: Insufficient documentation

## 2020-03-16 DIAGNOSIS — I10 Essential (primary) hypertension: Secondary | ICD-10-CM | POA: Diagnosis not present

## 2020-03-16 DIAGNOSIS — Z0000 Encounter for general adult medical examination without abnormal findings: Secondary | ICD-10-CM | POA: Diagnosis not present

## 2020-03-20 DIAGNOSIS — L089 Local infection of the skin and subcutaneous tissue, unspecified: Secondary | ICD-10-CM | POA: Diagnosis not present

## 2020-03-20 DIAGNOSIS — I83223 Varicose veins of left lower extremity with both ulcer of ankle and inflammation: Secondary | ICD-10-CM | POA: Diagnosis not present

## 2020-03-20 DIAGNOSIS — L97322 Non-pressure chronic ulcer of left ankle with fat layer exposed: Secondary | ICD-10-CM | POA: Diagnosis not present

## 2020-03-23 DIAGNOSIS — F411 Generalized anxiety disorder: Secondary | ICD-10-CM | POA: Diagnosis not present

## 2020-03-23 DIAGNOSIS — F331 Major depressive disorder, recurrent, moderate: Secondary | ICD-10-CM | POA: Diagnosis not present

## 2020-03-28 DIAGNOSIS — Z23 Encounter for immunization: Secondary | ICD-10-CM | POA: Diagnosis not present

## 2020-03-30 DIAGNOSIS — F411 Generalized anxiety disorder: Secondary | ICD-10-CM | POA: Diagnosis not present

## 2020-03-30 DIAGNOSIS — F331 Major depressive disorder, recurrent, moderate: Secondary | ICD-10-CM | POA: Diagnosis not present

## 2020-04-03 DIAGNOSIS — I872 Venous insufficiency (chronic) (peripheral): Secondary | ICD-10-CM | POA: Diagnosis not present

## 2020-04-03 DIAGNOSIS — L97822 Non-pressure chronic ulcer of other part of left lower leg with fat layer exposed: Secondary | ICD-10-CM | POA: Diagnosis not present

## 2020-04-03 DIAGNOSIS — L97322 Non-pressure chronic ulcer of left ankle with fat layer exposed: Secondary | ICD-10-CM | POA: Diagnosis not present

## 2020-04-03 DIAGNOSIS — L089 Local infection of the skin and subcutaneous tissue, unspecified: Secondary | ICD-10-CM | POA: Diagnosis not present

## 2020-04-03 DIAGNOSIS — S81802D Unspecified open wound, left lower leg, subsequent encounter: Secondary | ICD-10-CM | POA: Diagnosis not present

## 2020-04-06 DIAGNOSIS — H43813 Vitreous degeneration, bilateral: Secondary | ICD-10-CM | POA: Diagnosis not present

## 2020-04-06 DIAGNOSIS — F331 Major depressive disorder, recurrent, moderate: Secondary | ICD-10-CM | POA: Diagnosis not present

## 2020-04-06 DIAGNOSIS — H5202 Hypermetropia, left eye: Secondary | ICD-10-CM | POA: Diagnosis not present

## 2020-04-06 DIAGNOSIS — H5211 Myopia, right eye: Secondary | ICD-10-CM | POA: Diagnosis not present

## 2020-04-06 DIAGNOSIS — F411 Generalized anxiety disorder: Secondary | ICD-10-CM | POA: Diagnosis not present

## 2020-04-06 DIAGNOSIS — H353111 Nonexudative age-related macular degeneration, right eye, early dry stage: Secondary | ICD-10-CM | POA: Diagnosis not present

## 2020-04-06 DIAGNOSIS — H524 Presbyopia: Secondary | ICD-10-CM | POA: Diagnosis not present

## 2020-04-06 DIAGNOSIS — H04123 Dry eye syndrome of bilateral lacrimal glands: Secondary | ICD-10-CM | POA: Diagnosis not present

## 2020-04-13 DIAGNOSIS — F411 Generalized anxiety disorder: Secondary | ICD-10-CM | POA: Diagnosis not present

## 2020-04-13 DIAGNOSIS — F331 Major depressive disorder, recurrent, moderate: Secondary | ICD-10-CM | POA: Diagnosis not present

## 2020-04-18 DIAGNOSIS — Z0000 Encounter for general adult medical examination without abnormal findings: Secondary | ICD-10-CM | POA: Diagnosis not present

## 2020-04-18 DIAGNOSIS — K219 Gastro-esophageal reflux disease without esophagitis: Secondary | ICD-10-CM | POA: Diagnosis not present

## 2020-04-20 DIAGNOSIS — F411 Generalized anxiety disorder: Secondary | ICD-10-CM | POA: Diagnosis not present

## 2020-04-20 DIAGNOSIS — F331 Major depressive disorder, recurrent, moderate: Secondary | ICD-10-CM | POA: Diagnosis not present

## 2020-04-27 DIAGNOSIS — F331 Major depressive disorder, recurrent, moderate: Secondary | ICD-10-CM | POA: Diagnosis not present

## 2020-04-27 DIAGNOSIS — F411 Generalized anxiety disorder: Secondary | ICD-10-CM | POA: Diagnosis not present

## 2020-05-02 DIAGNOSIS — F331 Major depressive disorder, recurrent, moderate: Secondary | ICD-10-CM | POA: Diagnosis not present

## 2020-05-02 DIAGNOSIS — F411 Generalized anxiety disorder: Secondary | ICD-10-CM | POA: Diagnosis not present

## 2020-05-11 DIAGNOSIS — F331 Major depressive disorder, recurrent, moderate: Secondary | ICD-10-CM | POA: Diagnosis not present

## 2020-05-11 DIAGNOSIS — F411 Generalized anxiety disorder: Secondary | ICD-10-CM | POA: Diagnosis not present

## 2020-05-17 DIAGNOSIS — F331 Major depressive disorder, recurrent, moderate: Secondary | ICD-10-CM | POA: Diagnosis not present

## 2020-05-17 DIAGNOSIS — F411 Generalized anxiety disorder: Secondary | ICD-10-CM | POA: Diagnosis not present

## 2020-05-18 DIAGNOSIS — F411 Generalized anxiety disorder: Secondary | ICD-10-CM | POA: Diagnosis not present

## 2020-05-18 DIAGNOSIS — F331 Major depressive disorder, recurrent, moderate: Secondary | ICD-10-CM | POA: Diagnosis not present

## 2020-05-25 DIAGNOSIS — F411 Generalized anxiety disorder: Secondary | ICD-10-CM | POA: Diagnosis not present

## 2020-05-25 DIAGNOSIS — F331 Major depressive disorder, recurrent, moderate: Secondary | ICD-10-CM | POA: Diagnosis not present

## 2020-06-01 DIAGNOSIS — F411 Generalized anxiety disorder: Secondary | ICD-10-CM | POA: Diagnosis not present

## 2020-06-01 DIAGNOSIS — F331 Major depressive disorder, recurrent, moderate: Secondary | ICD-10-CM | POA: Diagnosis not present

## 2020-06-08 DIAGNOSIS — F331 Major depressive disorder, recurrent, moderate: Secondary | ICD-10-CM | POA: Diagnosis not present

## 2020-06-08 DIAGNOSIS — F411 Generalized anxiety disorder: Secondary | ICD-10-CM | POA: Diagnosis not present

## 2020-06-12 DIAGNOSIS — E875 Hyperkalemia: Secondary | ICD-10-CM | POA: Diagnosis not present

## 2020-06-12 DIAGNOSIS — I10 Essential (primary) hypertension: Secondary | ICD-10-CM | POA: Diagnosis not present

## 2020-06-12 DIAGNOSIS — E871 Hypo-osmolality and hyponatremia: Secondary | ICD-10-CM | POA: Diagnosis not present

## 2020-06-12 DIAGNOSIS — N390 Urinary tract infection, site not specified: Secondary | ICD-10-CM | POA: Diagnosis not present

## 2020-06-13 ENCOUNTER — Other Ambulatory Visit: Payer: Self-pay

## 2020-06-13 ENCOUNTER — Ambulatory Visit (INDEPENDENT_AMBULATORY_CARE_PROVIDER_SITE_OTHER): Payer: Medicare Other

## 2020-06-13 ENCOUNTER — Telehealth: Payer: Self-pay | Admitting: Sports Medicine

## 2020-06-13 DIAGNOSIS — S99921D Unspecified injury of right foot, subsequent encounter: Secondary | ICD-10-CM

## 2020-06-13 NOTE — Telephone Encounter (Signed)
Yes but where specifically on the foot and toes?  Which toes, top of the foot, inside/outside of the foot, bottom of the foot?  All of this is important to know which x-ray to order.

## 2020-06-13 NOTE — Telephone Encounter (Signed)
The daughter states that she is not sure what part of foot and Which toes because the whole Rt foot and toes are swollen and bruised. She said maybe the first 3 toes are worse than the others. That's all she could give me. I did ask and this was as specific as she could get

## 2020-06-13 NOTE — Telephone Encounter (Signed)
Right Foot and toes are swollen and bruised, not sure if she has broken it and her daughters would like for her to be worked in on Thursday or Friday this week? I did inform them to go ahead get Xray, Patient does not have a boot to wear while waiting for appt time. Let me know when to schedule and I will call her daughter back.

## 2020-06-14 ENCOUNTER — Ambulatory Visit (INDEPENDENT_AMBULATORY_CARE_PROVIDER_SITE_OTHER): Payer: Medicare Other

## 2020-06-14 ENCOUNTER — Ambulatory Visit (INDEPENDENT_AMBULATORY_CARE_PROVIDER_SITE_OTHER): Payer: Medicare Other | Admitting: Sports Medicine

## 2020-06-14 DIAGNOSIS — S99921D Unspecified injury of right foot, subsequent encounter: Secondary | ICD-10-CM

## 2020-06-14 DIAGNOSIS — S92414A Nondisplaced fracture of proximal phalanx of right great toe, initial encounter for closed fracture: Secondary | ICD-10-CM | POA: Diagnosis not present

## 2020-06-14 DIAGNOSIS — S92411A Displaced fracture of proximal phalanx of right great toe, initial encounter for closed fracture: Secondary | ICD-10-CM | POA: Diagnosis not present

## 2020-06-14 MED ORDER — PREDNISONE 50 MG PO TABS: ORAL_TABLET | ORAL | 0 refills | Status: DC

## 2020-06-14 NOTE — Assessment & Plan Note (Addendum)
2 weeks ago this pleasant 86 year old female tripped and fell, her right foot was forced into plantarflexion, she has severe pain at her great toe, as well as between the second and third toes with bruising and swelling. Her great toe is hot, tense, there is definitely an effusion. X-rays were read as normal and personally reviewed by me, I do not see any obvious fractures, I would like to use prednisone but I would need to ensure there is no fracture first, ordering a stat foot CT, hydrocodone. She has a postop shoe at home.  CT personally reviewed and does confirm a fracture through the big toe, hold off on prednisone, continue postop shoe, expect 6 to 8 weeks for healing.

## 2020-06-14 NOTE — Progress Notes (Addendum)
    Procedures performed today:    None.  Independent interpretation of notes and tests performed by another provider:   X-rays were read as normal and personally reviewed by me, I do not see any obvious fractures.  CT personally reviewed and does confirm a fracture through the big toe, transverse fracture through the proximal phalanx.  Brief History, Exam, Impression, and Recommendations:    Fracture of great toe, right, closed 2 weeks ago this pleasant 86 year old female tripped and fell, her right foot was forced into plantarflexion, she has severe pain at her great toe, as well as between the second and third toes with bruising and swelling. Her great toe is hot, tense, there is definitely an effusion. X-rays were read as normal and personally reviewed by me, I do not see any obvious fractures, I would like to use prednisone but I would need to ensure there is no fracture first, ordering a stat foot CT, hydrocodone. She has a postop shoe at home.  CT personally reviewed and does confirm a fracture through the big toe, hold off on prednisone, continue postop shoe, expect 6 to 8 weeks for healing.    ___________________________________________ Gwen Her. Dianah Field, M.D., ABFM., CAQSM. Primary Care and [place] Instructor of [place] [facility]

## 2020-06-15 DIAGNOSIS — F331 Major depressive disorder, recurrent, moderate: Secondary | ICD-10-CM | POA: Diagnosis not present

## 2020-06-15 DIAGNOSIS — F411 Generalized anxiety disorder: Secondary | ICD-10-CM | POA: Diagnosis not present

## 2020-07-20 DIAGNOSIS — F331 Major depressive disorder, recurrent, moderate: Secondary | ICD-10-CM | POA: Diagnosis not present

## 2020-07-20 DIAGNOSIS — F411 Generalized anxiety disorder: Secondary | ICD-10-CM | POA: Diagnosis not present

## 2020-07-27 DIAGNOSIS — F411 Generalized anxiety disorder: Secondary | ICD-10-CM | POA: Diagnosis not present

## 2020-07-27 DIAGNOSIS — F331 Major depressive disorder, recurrent, moderate: Secondary | ICD-10-CM | POA: Diagnosis not present

## 2020-08-03 DIAGNOSIS — M5416 Radiculopathy, lumbar region: Secondary | ICD-10-CM | POA: Diagnosis not present

## 2020-08-03 DIAGNOSIS — M7138 Other bursal cyst, other site: Secondary | ICD-10-CM | POA: Diagnosis not present

## 2020-08-03 DIAGNOSIS — M5136 Other intervertebral disc degeneration, lumbar region: Secondary | ICD-10-CM | POA: Diagnosis not present

## 2020-08-03 DIAGNOSIS — M48062 Spinal stenosis, lumbar region with neurogenic claudication: Secondary | ICD-10-CM | POA: Diagnosis not present

## 2020-08-17 DIAGNOSIS — F411 Generalized anxiety disorder: Secondary | ICD-10-CM | POA: Diagnosis not present

## 2020-08-17 DIAGNOSIS — F331 Major depressive disorder, recurrent, moderate: Secondary | ICD-10-CM | POA: Diagnosis not present

## 2020-08-24 DIAGNOSIS — M5136 Other intervertebral disc degeneration, lumbar region: Secondary | ICD-10-CM | POA: Diagnosis not present

## 2020-08-24 DIAGNOSIS — F331 Major depressive disorder, recurrent, moderate: Secondary | ICD-10-CM | POA: Diagnosis not present

## 2020-08-24 DIAGNOSIS — F411 Generalized anxiety disorder: Secondary | ICD-10-CM | POA: Diagnosis not present

## 2020-08-31 DIAGNOSIS — F411 Generalized anxiety disorder: Secondary | ICD-10-CM | POA: Diagnosis not present

## 2020-08-31 DIAGNOSIS — F331 Major depressive disorder, recurrent, moderate: Secondary | ICD-10-CM | POA: Diagnosis not present

## 2020-09-05 DIAGNOSIS — F331 Major depressive disorder, recurrent, moderate: Secondary | ICD-10-CM | POA: Diagnosis not present

## 2020-09-05 DIAGNOSIS — F411 Generalized anxiety disorder: Secondary | ICD-10-CM | POA: Diagnosis not present

## 2020-09-11 DIAGNOSIS — F411 Generalized anxiety disorder: Secondary | ICD-10-CM | POA: Diagnosis not present

## 2020-09-11 DIAGNOSIS — F331 Major depressive disorder, recurrent, moderate: Secondary | ICD-10-CM | POA: Diagnosis not present

## 2020-09-12 DIAGNOSIS — F331 Major depressive disorder, recurrent, moderate: Secondary | ICD-10-CM | POA: Diagnosis not present

## 2020-09-12 DIAGNOSIS — F411 Generalized anxiety disorder: Secondary | ICD-10-CM | POA: Diagnosis not present

## 2020-09-19 DIAGNOSIS — F331 Major depressive disorder, recurrent, moderate: Secondary | ICD-10-CM | POA: Diagnosis not present

## 2020-09-19 DIAGNOSIS — F411 Generalized anxiety disorder: Secondary | ICD-10-CM | POA: Diagnosis not present

## 2020-09-21 DIAGNOSIS — M48062 Spinal stenosis, lumbar region with neurogenic claudication: Secondary | ICD-10-CM | POA: Diagnosis not present

## 2020-09-21 DIAGNOSIS — M5136 Other intervertebral disc degeneration, lumbar region: Secondary | ICD-10-CM | POA: Diagnosis not present

## 2020-09-21 DIAGNOSIS — M7138 Other bursal cyst, other site: Secondary | ICD-10-CM | POA: Diagnosis not present

## 2020-09-21 DIAGNOSIS — M5416 Radiculopathy, lumbar region: Secondary | ICD-10-CM | POA: Diagnosis not present

## 2020-09-26 DIAGNOSIS — F331 Major depressive disorder, recurrent, moderate: Secondary | ICD-10-CM | POA: Diagnosis not present

## 2020-09-26 DIAGNOSIS — F411 Generalized anxiety disorder: Secondary | ICD-10-CM | POA: Diagnosis not present

## 2020-10-03 DIAGNOSIS — F411 Generalized anxiety disorder: Secondary | ICD-10-CM | POA: Diagnosis not present

## 2020-10-03 DIAGNOSIS — F331 Major depressive disorder, recurrent, moderate: Secondary | ICD-10-CM | POA: Diagnosis not present

## 2020-10-09 DIAGNOSIS — M48061 Spinal stenosis, lumbar region without neurogenic claudication: Secondary | ICD-10-CM | POA: Diagnosis not present

## 2020-10-09 DIAGNOSIS — Z9889 Other specified postprocedural states: Secondary | ICD-10-CM | POA: Diagnosis not present

## 2020-10-09 DIAGNOSIS — I10 Essential (primary) hypertension: Secondary | ICD-10-CM | POA: Diagnosis not present

## 2020-10-09 DIAGNOSIS — I482 Chronic atrial fibrillation, unspecified: Secondary | ICD-10-CM | POA: Insufficient documentation

## 2020-10-09 DIAGNOSIS — K219 Gastro-esophageal reflux disease without esophagitis: Secondary | ICD-10-CM | POA: Diagnosis not present

## 2020-10-09 DIAGNOSIS — K649 Unspecified hemorrhoids: Secondary | ICD-10-CM | POA: Diagnosis not present

## 2020-10-09 DIAGNOSIS — M810 Age-related osteoporosis without current pathological fracture: Secondary | ICD-10-CM | POA: Diagnosis not present

## 2020-10-09 DIAGNOSIS — K5904 Chronic idiopathic constipation: Secondary | ICD-10-CM | POA: Diagnosis not present

## 2020-10-09 DIAGNOSIS — R0989 Other specified symptoms and signs involving the circulatory and respiratory systems: Secondary | ICD-10-CM | POA: Diagnosis not present

## 2020-10-09 DIAGNOSIS — Z87440 Personal history of urinary (tract) infections: Secondary | ICD-10-CM | POA: Diagnosis not present

## 2020-10-10 DIAGNOSIS — F411 Generalized anxiety disorder: Secondary | ICD-10-CM | POA: Diagnosis not present

## 2020-10-10 DIAGNOSIS — F331 Major depressive disorder, recurrent, moderate: Secondary | ICD-10-CM | POA: Diagnosis not present

## 2020-10-12 DIAGNOSIS — I6523 Occlusion and stenosis of bilateral carotid arteries: Secondary | ICD-10-CM | POA: Diagnosis not present

## 2020-10-13 DIAGNOSIS — M5416 Radiculopathy, lumbar region: Secondary | ICD-10-CM | POA: Diagnosis not present

## 2020-10-13 DIAGNOSIS — M533 Sacrococcygeal disorders, not elsewhere classified: Secondary | ICD-10-CM | POA: Diagnosis not present

## 2020-10-13 DIAGNOSIS — M47816 Spondylosis without myelopathy or radiculopathy, lumbar region: Secondary | ICD-10-CM | POA: Diagnosis not present

## 2020-10-13 DIAGNOSIS — M48062 Spinal stenosis, lumbar region with neurogenic claudication: Secondary | ICD-10-CM | POA: Diagnosis not present

## 2020-10-13 DIAGNOSIS — M7138 Other bursal cyst, other site: Secondary | ICD-10-CM | POA: Diagnosis not present

## 2020-10-16 DIAGNOSIS — Z23 Encounter for immunization: Secondary | ICD-10-CM | POA: Diagnosis not present

## 2020-10-17 DIAGNOSIS — F331 Major depressive disorder, recurrent, moderate: Secondary | ICD-10-CM | POA: Diagnosis not present

## 2020-10-17 DIAGNOSIS — F411 Generalized anxiety disorder: Secondary | ICD-10-CM | POA: Diagnosis not present

## 2020-10-23 DIAGNOSIS — Z23 Encounter for immunization: Secondary | ICD-10-CM | POA: Diagnosis not present

## 2020-10-24 DIAGNOSIS — F411 Generalized anxiety disorder: Secondary | ICD-10-CM | POA: Diagnosis not present

## 2020-10-24 DIAGNOSIS — F331 Major depressive disorder, recurrent, moderate: Secondary | ICD-10-CM | POA: Diagnosis not present

## 2020-10-31 DIAGNOSIS — F331 Major depressive disorder, recurrent, moderate: Secondary | ICD-10-CM | POA: Diagnosis not present

## 2020-10-31 DIAGNOSIS — F411 Generalized anxiety disorder: Secondary | ICD-10-CM | POA: Diagnosis not present

## 2020-11-07 DIAGNOSIS — F331 Major depressive disorder, recurrent, moderate: Secondary | ICD-10-CM | POA: Diagnosis not present

## 2020-11-07 DIAGNOSIS — F411 Generalized anxiety disorder: Secondary | ICD-10-CM | POA: Diagnosis not present

## 2020-11-13 DIAGNOSIS — M47816 Spondylosis without myelopathy or radiculopathy, lumbar region: Secondary | ICD-10-CM | POA: Diagnosis not present

## 2020-11-17 DIAGNOSIS — F331 Major depressive disorder, recurrent, moderate: Secondary | ICD-10-CM | POA: Diagnosis not present

## 2020-11-17 DIAGNOSIS — F411 Generalized anxiety disorder: Secondary | ICD-10-CM | POA: Diagnosis not present

## 2020-12-05 DIAGNOSIS — F331 Major depressive disorder, recurrent, moderate: Secondary | ICD-10-CM | POA: Diagnosis not present

## 2020-12-05 DIAGNOSIS — F411 Generalized anxiety disorder: Secondary | ICD-10-CM | POA: Diagnosis not present

## 2020-12-07 DIAGNOSIS — F411 Generalized anxiety disorder: Secondary | ICD-10-CM | POA: Diagnosis not present

## 2020-12-07 DIAGNOSIS — F331 Major depressive disorder, recurrent, moderate: Secondary | ICD-10-CM | POA: Diagnosis not present

## 2020-12-12 DIAGNOSIS — F411 Generalized anxiety disorder: Secondary | ICD-10-CM | POA: Diagnosis not present

## 2020-12-12 DIAGNOSIS — F331 Major depressive disorder, recurrent, moderate: Secondary | ICD-10-CM | POA: Diagnosis not present

## 2020-12-18 DIAGNOSIS — N390 Urinary tract infection, site not specified: Secondary | ICD-10-CM | POA: Diagnosis not present

## 2020-12-18 DIAGNOSIS — I10 Essential (primary) hypertension: Secondary | ICD-10-CM | POA: Diagnosis not present

## 2020-12-18 DIAGNOSIS — E875 Hyperkalemia: Secondary | ICD-10-CM | POA: Diagnosis not present

## 2020-12-18 DIAGNOSIS — E871 Hypo-osmolality and hyponatremia: Secondary | ICD-10-CM | POA: Diagnosis not present

## 2020-12-18 DIAGNOSIS — D649 Anemia, unspecified: Secondary | ICD-10-CM | POA: Diagnosis not present

## 2020-12-19 DIAGNOSIS — F331 Major depressive disorder, recurrent, moderate: Secondary | ICD-10-CM | POA: Diagnosis not present

## 2020-12-19 DIAGNOSIS — F411 Generalized anxiety disorder: Secondary | ICD-10-CM | POA: Diagnosis not present

## 2021-01-04 DIAGNOSIS — F411 Generalized anxiety disorder: Secondary | ICD-10-CM | POA: Diagnosis not present

## 2021-01-04 DIAGNOSIS — F331 Major depressive disorder, recurrent, moderate: Secondary | ICD-10-CM | POA: Diagnosis not present

## 2021-01-18 DIAGNOSIS — F331 Major depressive disorder, recurrent, moderate: Secondary | ICD-10-CM | POA: Diagnosis not present

## 2021-01-18 DIAGNOSIS — F411 Generalized anxiety disorder: Secondary | ICD-10-CM | POA: Diagnosis not present

## 2021-01-24 DIAGNOSIS — F411 Generalized anxiety disorder: Secondary | ICD-10-CM | POA: Diagnosis not present

## 2021-01-24 DIAGNOSIS — F331 Major depressive disorder, recurrent, moderate: Secondary | ICD-10-CM | POA: Diagnosis not present

## 2021-01-31 DIAGNOSIS — F411 Generalized anxiety disorder: Secondary | ICD-10-CM | POA: Diagnosis not present

## 2021-01-31 DIAGNOSIS — F331 Major depressive disorder, recurrent, moderate: Secondary | ICD-10-CM | POA: Diagnosis not present

## 2021-02-01 DIAGNOSIS — F411 Generalized anxiety disorder: Secondary | ICD-10-CM | POA: Diagnosis not present

## 2021-02-01 DIAGNOSIS — F331 Major depressive disorder, recurrent, moderate: Secondary | ICD-10-CM | POA: Diagnosis not present

## 2021-02-08 DIAGNOSIS — F331 Major depressive disorder, recurrent, moderate: Secondary | ICD-10-CM | POA: Diagnosis not present

## 2021-02-08 DIAGNOSIS — F411 Generalized anxiety disorder: Secondary | ICD-10-CM | POA: Diagnosis not present

## 2021-02-15 DIAGNOSIS — F411 Generalized anxiety disorder: Secondary | ICD-10-CM | POA: Diagnosis not present

## 2021-02-15 DIAGNOSIS — F331 Major depressive disorder, recurrent, moderate: Secondary | ICD-10-CM | POA: Diagnosis not present

## 2021-02-20 DIAGNOSIS — K641 Second degree hemorrhoids: Secondary | ICD-10-CM | POA: Diagnosis not present

## 2021-02-22 DIAGNOSIS — F411 Generalized anxiety disorder: Secondary | ICD-10-CM | POA: Diagnosis not present

## 2021-02-22 DIAGNOSIS — F331 Major depressive disorder, recurrent, moderate: Secondary | ICD-10-CM | POA: Diagnosis not present

## 2021-03-01 DIAGNOSIS — F331 Major depressive disorder, recurrent, moderate: Secondary | ICD-10-CM | POA: Diagnosis not present

## 2021-03-01 DIAGNOSIS — F411 Generalized anxiety disorder: Secondary | ICD-10-CM | POA: Diagnosis not present

## 2021-03-02 DIAGNOSIS — E559 Vitamin D deficiency, unspecified: Secondary | ICD-10-CM | POA: Insufficient documentation

## 2021-03-02 DIAGNOSIS — N189 Chronic kidney disease, unspecified: Secondary | ICD-10-CM | POA: Insufficient documentation

## 2021-03-02 DIAGNOSIS — M79641 Pain in right hand: Secondary | ICD-10-CM | POA: Diagnosis not present

## 2021-03-02 DIAGNOSIS — M159 Polyosteoarthritis, unspecified: Secondary | ICD-10-CM | POA: Insufficient documentation

## 2021-03-08 DIAGNOSIS — F331 Major depressive disorder, recurrent, moderate: Secondary | ICD-10-CM | POA: Diagnosis not present

## 2021-03-08 DIAGNOSIS — F411 Generalized anxiety disorder: Secondary | ICD-10-CM | POA: Diagnosis not present

## 2021-03-15 DIAGNOSIS — F411 Generalized anxiety disorder: Secondary | ICD-10-CM | POA: Diagnosis not present

## 2021-03-15 DIAGNOSIS — F331 Major depressive disorder, recurrent, moderate: Secondary | ICD-10-CM | POA: Diagnosis not present

## 2021-03-22 DIAGNOSIS — I480 Paroxysmal atrial fibrillation: Secondary | ICD-10-CM | POA: Diagnosis not present

## 2021-03-22 DIAGNOSIS — F331 Major depressive disorder, recurrent, moderate: Secondary | ICD-10-CM | POA: Diagnosis not present

## 2021-03-22 DIAGNOSIS — I10 Essential (primary) hypertension: Secondary | ICD-10-CM | POA: Diagnosis not present

## 2021-03-22 DIAGNOSIS — F411 Generalized anxiety disorder: Secondary | ICD-10-CM | POA: Diagnosis not present

## 2021-03-29 DIAGNOSIS — F411 Generalized anxiety disorder: Secondary | ICD-10-CM | POA: Diagnosis not present

## 2021-03-29 DIAGNOSIS — F331 Major depressive disorder, recurrent, moderate: Secondary | ICD-10-CM | POA: Diagnosis not present

## 2021-04-02 DIAGNOSIS — F411 Generalized anxiety disorder: Secondary | ICD-10-CM | POA: Diagnosis not present

## 2021-04-02 DIAGNOSIS — F331 Major depressive disorder, recurrent, moderate: Secondary | ICD-10-CM | POA: Diagnosis not present

## 2021-04-05 DIAGNOSIS — M66231 Spontaneous rupture of extensor tendons, right forearm: Secondary | ICD-10-CM | POA: Diagnosis not present

## 2021-04-05 DIAGNOSIS — M19031 Primary osteoarthritis, right wrist: Secondary | ICD-10-CM | POA: Diagnosis not present

## 2021-04-05 DIAGNOSIS — M67833 Other specified disorders of tendon, right wrist: Secondary | ICD-10-CM | POA: Diagnosis not present

## 2021-04-05 DIAGNOSIS — S66314A Strain of extensor muscle, fascia and tendon of right ring finger at wrist and hand level, initial encounter: Secondary | ICD-10-CM | POA: Diagnosis not present

## 2021-04-05 DIAGNOSIS — S56511A Strain of other extensor muscle, fascia and tendon at forearm level, right arm, initial encounter: Secondary | ICD-10-CM | POA: Diagnosis not present

## 2021-04-05 DIAGNOSIS — M25331 Other instability, right wrist: Secondary | ICD-10-CM | POA: Diagnosis not present

## 2021-04-05 DIAGNOSIS — S66312A Strain of extensor muscle, fascia and tendon of right middle finger at wrist and hand level, initial encounter: Secondary | ICD-10-CM | POA: Diagnosis not present

## 2021-04-05 DIAGNOSIS — S66316A Strain of extensor muscle, fascia and tendon of right little finger at wrist and hand level, initial encounter: Secondary | ICD-10-CM | POA: Diagnosis not present

## 2021-04-05 DIAGNOSIS — M79641 Pain in right hand: Secondary | ICD-10-CM | POA: Diagnosis not present

## 2021-04-12 DIAGNOSIS — M79641 Pain in right hand: Secondary | ICD-10-CM | POA: Diagnosis not present

## 2021-04-17 DIAGNOSIS — M79641 Pain in right hand: Secondary | ICD-10-CM | POA: Diagnosis not present

## 2021-04-19 DIAGNOSIS — F411 Generalized anxiety disorder: Secondary | ICD-10-CM | POA: Diagnosis not present

## 2021-04-19 DIAGNOSIS — F331 Major depressive disorder, recurrent, moderate: Secondary | ICD-10-CM | POA: Diagnosis not present

## 2021-04-23 DIAGNOSIS — L304 Erythema intertrigo: Secondary | ICD-10-CM | POA: Diagnosis not present

## 2021-04-26 DIAGNOSIS — F331 Major depressive disorder, recurrent, moderate: Secondary | ICD-10-CM | POA: Diagnosis not present

## 2021-04-26 DIAGNOSIS — M79641 Pain in right hand: Secondary | ICD-10-CM | POA: Diagnosis not present

## 2021-04-26 DIAGNOSIS — F411 Generalized anxiety disorder: Secondary | ICD-10-CM | POA: Diagnosis not present

## 2021-05-01 DIAGNOSIS — F411 Generalized anxiety disorder: Secondary | ICD-10-CM | POA: Diagnosis not present

## 2021-05-01 DIAGNOSIS — F331 Major depressive disorder, recurrent, moderate: Secondary | ICD-10-CM | POA: Diagnosis not present

## 2021-05-03 DIAGNOSIS — M79641 Pain in right hand: Secondary | ICD-10-CM | POA: Diagnosis not present

## 2021-05-10 DIAGNOSIS — M79641 Pain in right hand: Secondary | ICD-10-CM | POA: Diagnosis not present

## 2021-05-17 DIAGNOSIS — M79641 Pain in right hand: Secondary | ICD-10-CM | POA: Diagnosis not present

## 2021-05-17 DIAGNOSIS — M25641 Stiffness of right hand, not elsewhere classified: Secondary | ICD-10-CM | POA: Diagnosis not present

## 2021-05-24 DIAGNOSIS — F411 Generalized anxiety disorder: Secondary | ICD-10-CM | POA: Diagnosis not present

## 2021-05-24 DIAGNOSIS — F331 Major depressive disorder, recurrent, moderate: Secondary | ICD-10-CM | POA: Diagnosis not present

## 2021-05-25 DIAGNOSIS — M79641 Pain in right hand: Secondary | ICD-10-CM | POA: Diagnosis not present

## 2021-05-29 DIAGNOSIS — F411 Generalized anxiety disorder: Secondary | ICD-10-CM | POA: Diagnosis not present

## 2021-05-29 DIAGNOSIS — F331 Major depressive disorder, recurrent, moderate: Secondary | ICD-10-CM | POA: Diagnosis not present

## 2021-05-31 DIAGNOSIS — M25641 Stiffness of right hand, not elsewhere classified: Secondary | ICD-10-CM | POA: Diagnosis not present

## 2021-06-04 DIAGNOSIS — F331 Major depressive disorder, recurrent, moderate: Secondary | ICD-10-CM | POA: Diagnosis not present

## 2021-06-04 DIAGNOSIS — F411 Generalized anxiety disorder: Secondary | ICD-10-CM | POA: Diagnosis not present

## 2021-06-07 DIAGNOSIS — M79641 Pain in right hand: Secondary | ICD-10-CM | POA: Diagnosis not present

## 2021-06-07 DIAGNOSIS — M25641 Stiffness of right hand, not elsewhere classified: Secondary | ICD-10-CM | POA: Diagnosis not present

## 2021-06-14 DIAGNOSIS — M25641 Stiffness of right hand, not elsewhere classified: Secondary | ICD-10-CM | POA: Diagnosis not present

## 2021-06-19 DIAGNOSIS — Z09 Encounter for follow-up examination after completed treatment for conditions other than malignant neoplasm: Secondary | ICD-10-CM | POA: Diagnosis not present

## 2021-06-21 DIAGNOSIS — F331 Major depressive disorder, recurrent, moderate: Secondary | ICD-10-CM | POA: Diagnosis not present

## 2021-06-21 DIAGNOSIS — F411 Generalized anxiety disorder: Secondary | ICD-10-CM | POA: Diagnosis not present

## 2021-06-22 DIAGNOSIS — M79641 Pain in right hand: Secondary | ICD-10-CM | POA: Diagnosis not present

## 2021-06-28 DIAGNOSIS — F331 Major depressive disorder, recurrent, moderate: Secondary | ICD-10-CM | POA: Diagnosis not present

## 2021-06-28 DIAGNOSIS — F411 Generalized anxiety disorder: Secondary | ICD-10-CM | POA: Diagnosis not present

## 2021-07-05 DIAGNOSIS — F411 Generalized anxiety disorder: Secondary | ICD-10-CM | POA: Diagnosis not present

## 2021-07-05 DIAGNOSIS — F331 Major depressive disorder, recurrent, moderate: Secondary | ICD-10-CM | POA: Diagnosis not present

## 2021-07-12 DIAGNOSIS — M79641 Pain in right hand: Secondary | ICD-10-CM | POA: Diagnosis not present

## 2021-07-12 DIAGNOSIS — F331 Major depressive disorder, recurrent, moderate: Secondary | ICD-10-CM | POA: Diagnosis not present

## 2021-07-12 DIAGNOSIS — M25641 Stiffness of right hand, not elsewhere classified: Secondary | ICD-10-CM | POA: Diagnosis not present

## 2021-07-12 DIAGNOSIS — F411 Generalized anxiety disorder: Secondary | ICD-10-CM | POA: Diagnosis not present

## 2021-07-17 DIAGNOSIS — L97512 Non-pressure chronic ulcer of other part of right foot with fat layer exposed: Secondary | ICD-10-CM | POA: Diagnosis not present

## 2021-07-19 DIAGNOSIS — F331 Major depressive disorder, recurrent, moderate: Secondary | ICD-10-CM | POA: Diagnosis not present

## 2021-07-19 DIAGNOSIS — F411 Generalized anxiety disorder: Secondary | ICD-10-CM | POA: Diagnosis not present

## 2021-07-26 DIAGNOSIS — F331 Major depressive disorder, recurrent, moderate: Secondary | ICD-10-CM | POA: Diagnosis not present

## 2021-07-26 DIAGNOSIS — F411 Generalized anxiety disorder: Secondary | ICD-10-CM | POA: Diagnosis not present

## 2021-07-30 DIAGNOSIS — F331 Major depressive disorder, recurrent, moderate: Secondary | ICD-10-CM | POA: Diagnosis not present

## 2021-07-30 DIAGNOSIS — F411 Generalized anxiety disorder: Secondary | ICD-10-CM | POA: Diagnosis not present

## 2021-08-01 DIAGNOSIS — L97512 Non-pressure chronic ulcer of other part of right foot with fat layer exposed: Secondary | ICD-10-CM | POA: Diagnosis not present

## 2021-08-02 DIAGNOSIS — F411 Generalized anxiety disorder: Secondary | ICD-10-CM | POA: Diagnosis not present

## 2021-08-02 DIAGNOSIS — F331 Major depressive disorder, recurrent, moderate: Secondary | ICD-10-CM | POA: Diagnosis not present

## 2021-08-07 DIAGNOSIS — F331 Major depressive disorder, recurrent, moderate: Secondary | ICD-10-CM | POA: Diagnosis not present

## 2021-08-07 DIAGNOSIS — F411 Generalized anxiety disorder: Secondary | ICD-10-CM | POA: Diagnosis not present

## 2021-08-16 DIAGNOSIS — F411 Generalized anxiety disorder: Secondary | ICD-10-CM | POA: Diagnosis not present

## 2021-08-16 DIAGNOSIS — F331 Major depressive disorder, recurrent, moderate: Secondary | ICD-10-CM | POA: Diagnosis not present

## 2021-08-23 DIAGNOSIS — F331 Major depressive disorder, recurrent, moderate: Secondary | ICD-10-CM | POA: Diagnosis not present

## 2021-08-23 DIAGNOSIS — F411 Generalized anxiety disorder: Secondary | ICD-10-CM | POA: Diagnosis not present

## 2021-08-30 DIAGNOSIS — F411 Generalized anxiety disorder: Secondary | ICD-10-CM | POA: Diagnosis not present

## 2021-08-30 DIAGNOSIS — F331 Major depressive disorder, recurrent, moderate: Secondary | ICD-10-CM | POA: Diagnosis not present

## 2021-09-06 DIAGNOSIS — F411 Generalized anxiety disorder: Secondary | ICD-10-CM | POA: Diagnosis not present

## 2021-09-06 DIAGNOSIS — F331 Major depressive disorder, recurrent, moderate: Secondary | ICD-10-CM | POA: Diagnosis not present

## 2021-09-13 DIAGNOSIS — F411 Generalized anxiety disorder: Secondary | ICD-10-CM | POA: Diagnosis not present

## 2021-09-13 DIAGNOSIS — F331 Major depressive disorder, recurrent, moderate: Secondary | ICD-10-CM | POA: Diagnosis not present

## 2021-09-17 ENCOUNTER — Other Ambulatory Visit: Payer: Self-pay

## 2021-09-17 ENCOUNTER — Ambulatory Visit (INDEPENDENT_AMBULATORY_CARE_PROVIDER_SITE_OTHER): Payer: Medicare Other | Admitting: Sports Medicine

## 2021-09-17 DIAGNOSIS — M79605 Pain in left leg: Secondary | ICD-10-CM

## 2021-09-17 DIAGNOSIS — M79604 Pain in right leg: Secondary | ICD-10-CM

## 2021-09-17 DIAGNOSIS — M48061 Spinal stenosis, lumbar region without neurogenic claudication: Secondary | ICD-10-CM | POA: Diagnosis not present

## 2021-09-17 NOTE — Assessment & Plan Note (Signed)
Has seenAllie also has a history of semihemilaminectomies, bilateral from L4-S1 by Dr. Christella Noa, she has had persistent discomfort, ineffective epidurals, Dr. Lynann Bologna, she is having some incoordination and clumsiness with both legs, stumbling. I think before we go down the pathway of lumbar spinal stenosis she needs to be evaluated by her primary care provider for reversible medical illness resulting in incoordination.

## 2021-09-17 NOTE — Assessment & Plan Note (Signed)
Deborah Robbins has had bilateral leg pain for some time now, localized mid shins bilaterally, she does have some dark discoloration/hemosiderin deposits in the classic distribution of venous stasis dermatitis, she also has fairly significant tenderness to palpation, mild/trace edema. I have asked her to follow this up with her primary care provider as this is not a sports medicine/orthopedics, and that she may consider compression stockings in the meantime.

## 2021-09-17 NOTE — Progress Notes (Signed)
    Procedures performed today:    None.  Independent interpretation of notes and tests performed by another provider:   None.  Brief History, Exam, Impression, and Recommendations:    Bilateral leg pain Deborah Robbins has had bilateral leg pain for some time now, localized mid shins bilaterally, she does have some dark discoloration/hemosiderin deposits in the classic distribution of venous stasis dermatitis, she also has fairly significant tenderness to palpation, mild/trace edema. I have asked her to follow this up with her primary care provider as this is not a sports medicine/orthopedics, and that she may consider compression stockings in the meantime.  Lumbar spinal stenosis Has seenAllie also has a history of semihemilaminectomies, bilateral from L4-S1 by Dr. Christella Noa, she has had persistent discomfort, ineffective epidurals, Dr. Lynann Bologna, she is having some incoordination and clumsiness with both legs, stumbling. I think before we go down the pathway of lumbar spinal stenosis she needs to be evaluated by her primary care provider for reversible medical illness resulting in incoordination.    ___________________________________________ Gwen Her. Dianah Field, M.D., ABFM., CAQSM. Primary Care and [place] Instructor of [place] [facility]

## 2021-09-20 DIAGNOSIS — F331 Major depressive disorder, recurrent, moderate: Secondary | ICD-10-CM | POA: Diagnosis not present

## 2021-09-20 DIAGNOSIS — F411 Generalized anxiety disorder: Secondary | ICD-10-CM | POA: Diagnosis not present

## 2021-09-21 DIAGNOSIS — M25531 Pain in right wrist: Secondary | ICD-10-CM | POA: Diagnosis not present

## 2021-09-21 DIAGNOSIS — S66811S Strain of other specified muscles, fascia and tendons at wrist and hand level, right hand, sequela: Secondary | ICD-10-CM | POA: Diagnosis not present

## 2021-09-27 DIAGNOSIS — F411 Generalized anxiety disorder: Secondary | ICD-10-CM | POA: Diagnosis not present

## 2021-09-27 DIAGNOSIS — F331 Major depressive disorder, recurrent, moderate: Secondary | ICD-10-CM | POA: Diagnosis not present

## 2021-10-01 DIAGNOSIS — F411 Generalized anxiety disorder: Secondary | ICD-10-CM | POA: Diagnosis not present

## 2021-10-01 DIAGNOSIS — F331 Major depressive disorder, recurrent, moderate: Secondary | ICD-10-CM | POA: Diagnosis not present

## 2021-10-04 DIAGNOSIS — R29898 Other symptoms and signs involving the musculoskeletal system: Secondary | ICD-10-CM | POA: Diagnosis not present

## 2021-10-04 DIAGNOSIS — G720 Drug-induced myopathy: Secondary | ICD-10-CM | POA: Diagnosis not present

## 2021-10-04 DIAGNOSIS — I351 Nonrheumatic aortic (valve) insufficiency: Secondary | ICD-10-CM | POA: Diagnosis not present

## 2021-10-04 DIAGNOSIS — Z23 Encounter for immunization: Secondary | ICD-10-CM | POA: Diagnosis not present

## 2021-10-04 DIAGNOSIS — H3530 Unspecified macular degeneration: Secondary | ICD-10-CM | POA: Diagnosis not present

## 2021-10-04 DIAGNOSIS — I480 Paroxysmal atrial fibrillation: Secondary | ICD-10-CM | POA: Diagnosis not present

## 2021-10-04 DIAGNOSIS — Z7689 Persons encountering health services in other specified circumstances: Secondary | ICD-10-CM | POA: Diagnosis not present

## 2021-10-04 DIAGNOSIS — F331 Major depressive disorder, recurrent, moderate: Secondary | ICD-10-CM | POA: Diagnosis not present

## 2021-10-04 DIAGNOSIS — N390 Urinary tract infection, site not specified: Secondary | ICD-10-CM | POA: Diagnosis not present

## 2021-10-04 DIAGNOSIS — T466X5A Adverse effect of antihyperlipidemic and antiarteriosclerotic drugs, initial encounter: Secondary | ICD-10-CM | POA: Diagnosis not present

## 2021-10-04 DIAGNOSIS — E785 Hyperlipidemia, unspecified: Secondary | ICD-10-CM | POA: Diagnosis not present

## 2021-10-04 DIAGNOSIS — I10 Essential (primary) hypertension: Secondary | ICD-10-CM | POA: Diagnosis not present

## 2021-10-04 DIAGNOSIS — F411 Generalized anxiety disorder: Secondary | ICD-10-CM | POA: Diagnosis not present

## 2021-10-04 DIAGNOSIS — R7303 Prediabetes: Secondary | ICD-10-CM | POA: Diagnosis not present

## 2021-10-11 DIAGNOSIS — F331 Major depressive disorder, recurrent, moderate: Secondary | ICD-10-CM | POA: Diagnosis not present

## 2021-10-11 DIAGNOSIS — F411 Generalized anxiety disorder: Secondary | ICD-10-CM | POA: Diagnosis not present

## 2021-10-18 DIAGNOSIS — F411 Generalized anxiety disorder: Secondary | ICD-10-CM | POA: Diagnosis not present

## 2021-10-18 DIAGNOSIS — F331 Major depressive disorder, recurrent, moderate: Secondary | ICD-10-CM | POA: Diagnosis not present

## 2021-10-25 DIAGNOSIS — F411 Generalized anxiety disorder: Secondary | ICD-10-CM | POA: Diagnosis not present

## 2021-10-25 DIAGNOSIS — F331 Major depressive disorder, recurrent, moderate: Secondary | ICD-10-CM | POA: Diagnosis not present

## 2021-10-29 DIAGNOSIS — L0211 Cutaneous abscess of neck: Secondary | ICD-10-CM | POA: Diagnosis not present

## 2021-10-29 DIAGNOSIS — S81812A Laceration without foreign body, left lower leg, initial encounter: Secondary | ICD-10-CM | POA: Diagnosis not present

## 2021-10-29 DIAGNOSIS — K644 Residual hemorrhoidal skin tags: Secondary | ICD-10-CM | POA: Diagnosis not present

## 2021-11-01 DIAGNOSIS — F411 Generalized anxiety disorder: Secondary | ICD-10-CM | POA: Diagnosis not present

## 2021-11-01 DIAGNOSIS — F331 Major depressive disorder, recurrent, moderate: Secondary | ICD-10-CM | POA: Diagnosis not present

## 2021-11-06 DIAGNOSIS — R221 Localized swelling, mass and lump, neck: Secondary | ICD-10-CM | POA: Diagnosis not present

## 2021-11-08 DIAGNOSIS — F411 Generalized anxiety disorder: Secondary | ICD-10-CM | POA: Diagnosis not present

## 2021-11-08 DIAGNOSIS — F331 Major depressive disorder, recurrent, moderate: Secondary | ICD-10-CM | POA: Diagnosis not present

## 2021-11-15 DIAGNOSIS — F331 Major depressive disorder, recurrent, moderate: Secondary | ICD-10-CM | POA: Diagnosis not present

## 2021-11-15 DIAGNOSIS — F411 Generalized anxiety disorder: Secondary | ICD-10-CM | POA: Diagnosis not present

## 2021-11-29 DIAGNOSIS — F331 Major depressive disorder, recurrent, moderate: Secondary | ICD-10-CM | POA: Diagnosis not present

## 2021-12-03 DIAGNOSIS — N390 Urinary tract infection, site not specified: Secondary | ICD-10-CM | POA: Diagnosis not present

## 2021-12-03 DIAGNOSIS — I10 Essential (primary) hypertension: Secondary | ICD-10-CM | POA: Diagnosis not present

## 2021-12-06 DIAGNOSIS — F331 Major depressive disorder, recurrent, moderate: Secondary | ICD-10-CM | POA: Diagnosis not present

## 2021-12-06 DIAGNOSIS — F411 Generalized anxiety disorder: Secondary | ICD-10-CM | POA: Diagnosis not present

## 2021-12-10 DIAGNOSIS — R221 Localized swelling, mass and lump, neck: Secondary | ICD-10-CM | POA: Diagnosis not present

## 2021-12-10 DIAGNOSIS — F419 Anxiety disorder, unspecified: Secondary | ICD-10-CM | POA: Diagnosis not present

## 2021-12-10 DIAGNOSIS — K648 Other hemorrhoids: Secondary | ICD-10-CM | POA: Diagnosis not present

## 2021-12-10 DIAGNOSIS — D234 Other benign neoplasm of skin of scalp and neck: Secondary | ICD-10-CM | POA: Diagnosis not present

## 2021-12-10 DIAGNOSIS — F32A Depression, unspecified: Secondary | ICD-10-CM | POA: Diagnosis not present

## 2021-12-10 DIAGNOSIS — Z79899 Other long term (current) drug therapy: Secondary | ICD-10-CM | POA: Diagnosis not present

## 2021-12-10 DIAGNOSIS — K219 Gastro-esophageal reflux disease without esophagitis: Secondary | ICD-10-CM | POA: Diagnosis not present

## 2021-12-10 DIAGNOSIS — I480 Paroxysmal atrial fibrillation: Secondary | ICD-10-CM | POA: Diagnosis not present

## 2021-12-10 DIAGNOSIS — L720 Epidermal cyst: Secondary | ICD-10-CM | POA: Diagnosis not present

## 2021-12-10 DIAGNOSIS — Z87891 Personal history of nicotine dependence: Secondary | ICD-10-CM | POA: Diagnosis not present

## 2021-12-10 DIAGNOSIS — I10 Essential (primary) hypertension: Secondary | ICD-10-CM | POA: Diagnosis not present

## 2021-12-10 DIAGNOSIS — K644 Residual hemorrhoidal skin tags: Secondary | ICD-10-CM | POA: Diagnosis not present

## 2021-12-13 DIAGNOSIS — F411 Generalized anxiety disorder: Secondary | ICD-10-CM | POA: Diagnosis not present

## 2021-12-13 DIAGNOSIS — F331 Major depressive disorder, recurrent, moderate: Secondary | ICD-10-CM | POA: Diagnosis not present

## 2021-12-19 DIAGNOSIS — F331 Major depressive disorder, recurrent, moderate: Secondary | ICD-10-CM | POA: Diagnosis not present

## 2021-12-19 DIAGNOSIS — F411 Generalized anxiety disorder: Secondary | ICD-10-CM | POA: Diagnosis not present

## 2021-12-20 DIAGNOSIS — F331 Major depressive disorder, recurrent, moderate: Secondary | ICD-10-CM | POA: Diagnosis not present

## 2021-12-20 DIAGNOSIS — Z9889 Other specified postprocedural states: Secondary | ICD-10-CM | POA: Diagnosis not present

## 2021-12-20 DIAGNOSIS — F411 Generalized anxiety disorder: Secondary | ICD-10-CM | POA: Diagnosis not present

## 2022-01-03 ENCOUNTER — Other Ambulatory Visit: Payer: Self-pay

## 2022-01-03 ENCOUNTER — Encounter: Payer: Self-pay | Admitting: Family Medicine

## 2022-01-03 ENCOUNTER — Ambulatory Visit (INDEPENDENT_AMBULATORY_CARE_PROVIDER_SITE_OTHER): Payer: Medicare Other | Admitting: Family Medicine

## 2022-01-03 DIAGNOSIS — I482 Chronic atrial fibrillation, unspecified: Secondary | ICD-10-CM | POA: Diagnosis not present

## 2022-01-03 DIAGNOSIS — I10 Essential (primary) hypertension: Secondary | ICD-10-CM

## 2022-01-03 DIAGNOSIS — F411 Generalized anxiety disorder: Secondary | ICD-10-CM

## 2022-01-03 DIAGNOSIS — N1832 Chronic kidney disease, stage 3b: Secondary | ICD-10-CM | POA: Diagnosis not present

## 2022-01-03 NOTE — Assessment & Plan Note (Signed)
This is managed by her psychiatrist, currently treated with BuSpar and alprazolam.  Stable at this time.  She will continue to see her therapist on a regular basis as well

## 2022-01-03 NOTE — Progress Notes (Signed)
Deborah Robbins - 88 y.o. female MRN [id number]  Date of birth: December 24, 1929  Subjective Chief Complaint  Patient presents with   Establish Care    HPI Deborah Robbins is a 88 year old female here today for initial visit to establish care.  She has history of hypertension, paroxysmal atrial fibrillation, depression with anxiety, CKD and chronic low back pain with spinal stenosis.  She denies new concerns today.  Atrial fibrillation is rate controlled with diltiazem and metoprolol.  Medication managed by cardiology.  Denies symptoms related to A. fib at this time including chest pain, palpitations or dyspnea.  Blood pressure has been well controlled with lisinopril and metoprolol.  She is followed by nephrology as well for history of CKD and recurrent UTIs.  Renal function has been stable.  She has seen psychiatry for anxiety.  She is also seeing a therapist.  Several personal tragedies throughout her life including loss of 4 of her children and her husband.  Medications for management include buspirone and alprazolam  ROS:  A comprehensive ROS was completed and negative except as noted per HPI      No Known Allergies  Past Medical History:  Diagnosis Date   Anemia    low iron   Anxiety    Arthritis    Depression    Diverticulitis    Frequent urinary tract infections    GERD (gastroesophageal reflux disease)    Hypertension    Mitral regurgitation    Osteoporosis    PAF (paroxysmal atrial fibrillation) ([place])    03/2017 admitted to [facility] with afib with RVR-->SR, discharged on diltiazem (had drop in H/H on anticoag, so d/c'd)   PONV (postoperative nausea and vomiting)     Past Surgical History:  Procedure Laterality Date   ABDOMINAL HYSTERECTOMY     APPENDECTOMY     CHOLECYSTECTOMY     COLONOSCOPY     GALLBLADDER SURGERY     HERNIA REPAIR     abdominal hernia   KNEE ARTHROSCOPY W/ MENISCAL REPAIR Right    KYPHOPLASTY     LUMBAR LAMINECTOMY/DECOMPRESSION  MICRODISCECTOMY Left 04/09/2018   Procedure: LAMINECTOMY FOR FACET/SYNOVIAL CYST LUMBAR FOUR   - LUMBAR FIVE , LUMBAR FIVE  - SACRAL ONE  LEFT;  Surgeon: Ashok Pall, MD;  Location: [facility];  Service: Neurosurgery;  Laterality: Left;    Social History   Socioeconomic History   Marital status: Widowed    Spouse name: Not on file   Number of children: Not on file   Years of education: Not on file   Highest education level: Not on file  Occupational History   Occupation: Retired  Tobacco Use   Smoking status: Former    Types: Cigarettes    Quit date: 12/30/1966    Years since quitting: 55.0    Passive exposure: Never   Smokeless tobacco: Never  Vaping Use   Vaping Use: Never used  Substance and Sexual Activity   Alcohol use: Never   Drug use: Never   Sexual activity: Not Currently    Partners: Male  Other Topics Concern   Not on file  Social History Narrative   ** Merged History Encounter **       Social Determinants of Health   Financial Resource Strain: Not on file  Food Insecurity: Not on file  Transportation Needs: Not on file  Physical Activity: Not on file  Stress: Not on file  Social Connections: Not on file    Family History  Problem Relation Age of  Onset   Heart attack Neg Hx    Diabetes Neg Hx    Cancer Neg Hx     Health Maintenance  Topic Date Due   Zoster Vaccines- Shingrix (1 of 2) Never done   Pneumonia Vaccine 55+ Years old (2 - PCV) 05/30/2016   COVID-19 Vaccine (5 - Booster) 05/23/2020   TETANUS/TDAP  12/24/2029   INFLUENZA VACCINE  Completed   DEXA SCAN  Completed   HPV VACCINES  Aged Out     ----------------------------------------------------------------------------------------------------------------------------------------------------------------------------------------------------------------- Physical Exam BP (!) 159/64 (BP Location: Left Arm, Patient Position: Sitting, Cuff Size: Large)    Pulse (!) 42    Ht 5\' 4"  (1.626 m)    Wt 143  lb (64.9 kg)    SpO2 96%    BMI 24.55 kg/m   Physical Exam Constitutional:      Appearance: Normal appearance.  Eyes:     General: No scleral icterus. Cardiovascular:     Rate and Rhythm: Normal rate and regular rhythm.  Pulmonary:     Effort: Pulmonary effort is normal.     Breath sounds: Normal breath sounds.  Musculoskeletal:     Cervical back: Neck supple.  Neurological:     Mental Status: She is alert.  Psychiatric:        Mood and Affect: Mood normal.        Behavior: Behavior normal.    ------------------------------------------------------------------------------------------------------------------------------------------------------------------------------------------------------------------- Assessment and Plan  Essential hypertension Blood pressure is elevated today however has had history of elevated readings in the doctor's office.  Hypertension medications are currently managed by her cardiologist.  Atrial fibrillation, chronic ([place]) Stable with good rate control on diltiazem and metoprolol.  She will continue to follow with cardiology.  Chronic renal disease [facility] in [place].  Renal function has been stable.  GAD (generalized anxiety disorder) This is managed by her psychiatrist, currently treated with BuSpar and alprazolam.  Stable at this time.  She will continue to see her therapist on a regular basis as well   No orders of the defined types were placed in this encounter.   Return in about 4 months (around 05/03/2022) for HTN.    This visit occurred during the SARS-CoV-2 public health emergency.  Safety protocols were in place, including screening questions prior to the visit, additional usage of staff PPE, and extensive cleaning of exam room while observing appropriate contact time as indicated for disinfecting solutions.

## 2022-01-03 NOTE — Patient Instructions (Signed)
Very nice to meet you today! Continue current medications. See me again in about 4 months.

## 2022-01-03 NOTE — Assessment & Plan Note (Signed)
Followed by [facility] in [place].  Renal function has been stable.

## 2022-01-03 NOTE — Assessment & Plan Note (Signed)
Stable with good rate control on diltiazem and metoprolol.  She will continue to follow with cardiology.

## 2022-01-03 NOTE — Assessment & Plan Note (Signed)
Blood pressure is elevated today however has had history of elevated readings in the doctor's office.  Hypertension medications are currently managed by her cardiologist.

## 2022-01-30 ENCOUNTER — Encounter: Payer: Self-pay | Admitting: Family Medicine

## 2022-01-30 ENCOUNTER — Ambulatory Visit (INDEPENDENT_AMBULATORY_CARE_PROVIDER_SITE_OTHER): Payer: Medicare Other | Admitting: Family Medicine

## 2022-01-30 ENCOUNTER — Other Ambulatory Visit: Payer: Self-pay

## 2022-01-30 DIAGNOSIS — R059 Cough, unspecified: Secondary | ICD-10-CM | POA: Diagnosis not present

## 2022-01-30 MED ORDER — BENZONATATE 200 MG PO CAPS: 200.0000 mg | ORAL_CAPSULE | Freq: Two times a day (BID) | ORAL | 0 refills | Status: DC | PRN

## 2022-01-30 NOTE — Patient Instructions (Addendum)
You may use delsym as needed for cough.  Additionally you may add tessalon if needed.  Be sure to stay well hydrated.  You may use tylenol 1000mg  (2 extra strength tabs) every 8 hours or 650mg  (tylenol arthritis) every 6 hours.   Upper Respiratory Infection, Adult An upper respiratory infection (URI) is a common viral infection of the nose, throat, and upper air passages that lead to the lungs. The most common type of URI is the common cold. URIs usually get better on their own, without medical treatment. What are the causes? A URI is caused by a virus. You may catch a virus by: Breathing in droplets from an infected person's cough or sneeze. Touching something that has been exposed to the virus (is contaminated) and then touching your mouth, nose, or eyes. What increases the risk? You are more likely to get a URI if: You are very young or very old. You have close contact with others, such as at work, school, or a health care facility. You smoke. You have long-term (chronic) heart or lung disease. You have a weakened disease-fighting system (immune system). You have nasal allergies or asthma. You are experiencing a lot of stress. You have poor nutrition. What are the signs or symptoms? A URI usually involves some of the following symptoms: Runny or stuffy (congested) nose. Cough. Sneezing. Sore throat. Headache. Fatigue. Fever. Loss of appetite. Pain in your forehead, behind your eyes, and over your cheekbones (sinus pain). Muscle aches. Redness or irritation of the eyes. Pressure in the ears or face. How is this diagnosed? This condition may be diagnosed based on your medical history and symptoms, and a physical exam. Your health care provider may use a swab to take a mucus sample from your nose (nasal swab). This sample can be tested to determine what virus is causing the illness. How is this treated? URIs usually get better on their own within 7-10 days. Medicines cannot  cure URIs, but your health care provider may recommend certain medicines to help relieve symptoms, such as: Over-the-counter cold medicines. Cough suppressants. Coughing is a type of defense against infection that helps to clear the respiratory system, so take these medicines only as recommended by your health care provider. Fever-reducing medicines. Follow these instructions at home: Activity Rest as needed. If you have a fever, stay home from work or school until your fever is gone or until your health care provider says your URI cannot spread to other people (is no longer contagious). Your health care provider may have you wear a face mask to prevent your infection from spreading. Relieving symptoms Gargle with a mixture of salt and water 3-4 times a day or as needed. To make salt water, completely dissolve -1 tsp (3-6 g) of salt in 1 cup (237 mL) of warm water. Use a cool-mist humidifier to add moisture to the air. This can help you breathe more easily. Eating and drinking  Drink enough fluid to keep your urine pale yellow. Eat soups and other clear broths. General instructions  Take over-the-counter and prescription medicines only as told by your health care provider. These include cold medicines, fever reducers, and cough suppressants. Do not use any products that contain nicotine or tobacco. These products include cigarettes, chewing tobacco, and vaping devices, such as e-cigarettes. If you need help quitting, ask your health care provider. Stay away from secondhand smoke. Stay up to date on all immunizations, including the yearly (annual) flu vaccine. Keep all follow-up visits. This is important.  How to prevent the spread of infection to others URIs can be contagious. To prevent the infection from spreading: Wash your hands with soap and water for at least 20 seconds. If soap and water are not available, use hand sanitizer. Avoid touching your mouth, face, eyes, or nose. Cough or  sneeze into a tissue or your sleeve or elbow instead of into your hand or into the air.  Contact a health care provider if: You are getting worse instead of better. You have a fever or chills. Your mucus is brown or red. You have yellow or brown discharge coming from your nose. You have pain in your face, especially when you bend forward. You have swollen neck glands. You have pain while swallowing. You have white areas in the back of your throat. Get help right away if: You have shortness of breath that gets worse. You have severe or persistent: Headache. Ear pain. Sinus pain. Chest pain. You have chronic lung disease along with any of the following: Making high-pitched whistling sounds when you breathe, most often when you breathe out (wheezing). Prolonged cough (more than 14 days). Coughing up blood. A change in your usual mucus. You have a stiff neck. You have changes in your: Vision. Hearing. Thinking. Mood. These symptoms may be an emergency. Get help right away. Call 911. Do not wait to see if the symptoms will go away. Do not drive yourself to the hospital. Summary An upper respiratory infection (URI) is a common infection of the nose, throat, and upper air passages that lead to the lungs. A URI is caused by a virus. URIs usually get better on their own within 7-10 days. Medicines cannot cure URIs, but your health care provider may recommend certain medicines to help relieve symptoms. This information is not intended to replace advice given to you by your health care provider. Make sure you discuss any questions you have with your health care provider. Document Revised: 07/18/2021 Document Reviewed: 07/18/2021 Elsevier Patient Education  [place].

## 2022-01-30 NOTE — Assessment & Plan Note (Signed)
Had cough and congestion that lasted approximately a day and a half and has essentially resolved at this point.  Likely viral in nature.  She may use Delsym over-the-counter if needed if cough returns.  Additionally I am going to send in [facility] for her to have on hand if having any worsening.  Red flag symptoms discussed including development of chest pain, shortness of breath, wheezing or difficulty breathing.

## 2022-01-30 NOTE — Progress Notes (Signed)
Deborah Robbins - 88 y.o. female MRN [id number]  Date of birth: 05-24-1929  Subjective Chief Complaint  Patient presents with   Cough    HPI Deborah Robbins is a 88 year old female here today with complaint of cough.  Symptoms started yesterday with mild congestion and cough.  Has improved today.  Denies shortness of breath, wheezing, chest pain, fever or chills.  COVID testing negative.  ROS:  A comprehensive ROS was completed and negative except as noted per HPI  No Known Allergies  Past Medical History:  Diagnosis Date   Anemia    low iron   Anxiety    Arthritis    Depression    Diverticulitis    Frequent urinary tract infections    GERD (gastroesophageal reflux disease)    Hypertension    Mitral regurgitation    Osteoporosis    PAF (paroxysmal atrial fibrillation) ([place])    03/2017 admitted to [facility] with afib with RVR-->SR, discharged on diltiazem (had drop in H/H on anticoag, so d/c'd)   PONV (postoperative nausea and vomiting)     Past Surgical History:  Procedure Laterality Date   ABDOMINAL HYSTERECTOMY     APPENDECTOMY     CHOLECYSTECTOMY     COLONOSCOPY     GALLBLADDER SURGERY     HERNIA REPAIR     abdominal hernia   KNEE ARTHROSCOPY W/ MENISCAL REPAIR Right    KYPHOPLASTY     LUMBAR LAMINECTOMY/DECOMPRESSION MICRODISCECTOMY Left 04/09/2018   Procedure: LAMINECTOMY FOR FACET/SYNOVIAL CYST LUMBAR FOUR   - LUMBAR FIVE , LUMBAR FIVE  - SACRAL ONE  LEFT;  Surgeon: Ashok Pall, MD;  Location: [place];  Service: Neurosurgery;  Laterality: Left;    Social History   Socioeconomic History   Marital status: Widowed    Spouse name: Not on file   Number of children: Not on file   Years of education: Not on file   Highest education level: Not on file  Occupational History   Occupation: Retired  Tobacco Use   Smoking status: Former    Types: Cigarettes    Quit date: 12/30/1966    Years since quitting: 55.1    Passive exposure: Never   Smokeless tobacco: Never   Vaping Use   Vaping Use: Never used  Substance and Sexual Activity   Alcohol use: Never   Drug use: Never   Sexual activity: Not Currently    Partners: Male  Other Topics Concern   Not on file  Social History Narrative   ** Merged History Encounter **       Social Determinants of Health   Financial Resource Strain: Not on file  Food Insecurity: Not on file  Transportation Needs: Not on file  Physical Activity: Not on file  Stress: Not on file  Social Connections: Not on file    Family History  Problem Relation Age of Onset   Heart attack Neg Hx    Diabetes Neg Hx    Cancer Neg Hx     Health Maintenance  Topic Date Due   Zoster Vaccines- Shingrix (1 of 2) Never done   Pneumonia Vaccine 68+ Years old (2 - PCV) 05/30/2016   COVID-19 Vaccine (5 - Booster) 05/23/2020   TETANUS/TDAP  12/24/2029   INFLUENZA VACCINE  Completed   DEXA SCAN  Completed   HPV VACCINES  Aged Out     ----------------------------------------------------------------------------------------------------------------------------------------------------------------------------------------------------------------- Physical Exam BP (!) 150/76 (BP Location: Left Arm, Patient Position: Sitting, Cuff Size: Small)    Pulse 87  Temp 97.6 F (36.4 C)    Ht 5\' 4"  (1.626 m)    Wt 143 lb (64.9 kg)    SpO2 95%    BMI 24.55 kg/m   Physical Exam Constitutional:      Appearance: Normal appearance.  Eyes:     General: No scleral icterus. Cardiovascular:     Rate and Rhythm: Normal rate and regular rhythm.  Pulmonary:     Effort: Pulmonary effort is normal.     Breath sounds: Normal breath sounds.  Musculoskeletal:     Cervical back: Neck supple.  Neurological:     Mental Status: She is alert.  Psychiatric:        Mood and Affect: Mood normal.        Behavior: Behavior normal.     ------------------------------------------------------------------------------------------------------------------------------------------------------------------------------------------------------------------- Assessment and Plan  Cough Had cough and congestion that lasted approximately a day and a half and has essentially resolved at this point.  Likely viral in nature.  She may use Delsym over-the-counter if needed if cough returns.  Additionally I am going to send in [facility] for her to have on hand if having any worsening.  Red flag symptoms discussed including development of chest pain, shortness of breath, wheezing or difficulty breathing.   Meds ordered this encounter  Medications   benzonatate (TESSALON) 200 MG capsule    Sig: Take 1 capsule (200 mg total) by mouth 2 (two) times daily as needed for cough.    Dispense:  20 capsule    Refill:  0    No follow-ups on file.    This visit occurred during the SARS-CoV-2 public health emergency.  Safety protocols were in place, including screening questions prior to the visit, additional usage of staff PPE, and extensive cleaning of exam room while observing appropriate contact time as indicated for disinfecting solutions.

## 2022-01-31 ENCOUNTER — Telehealth: Payer: Self-pay

## 2022-01-31 NOTE — Telephone Encounter (Signed)
Pt's daughter called requesting safe medication for congestion.   Please advise.

## 2022-02-01 NOTE — Telephone Encounter (Signed)
Advised Deborah Robbins to take regular/plain Mucinex for congestion. She expressed understanding.

## 2022-03-21 ENCOUNTER — Ambulatory Visit (INDEPENDENT_AMBULATORY_CARE_PROVIDER_SITE_OTHER): Payer: Medicare Other

## 2022-03-21 ENCOUNTER — Ambulatory Visit (INDEPENDENT_AMBULATORY_CARE_PROVIDER_SITE_OTHER): Payer: Medicare Other | Admitting: Sports Medicine

## 2022-03-21 ENCOUNTER — Other Ambulatory Visit: Payer: Self-pay

## 2022-03-21 DIAGNOSIS — M170 Bilateral primary osteoarthritis of knee: Secondary | ICD-10-CM

## 2022-03-21 DIAGNOSIS — R29898 Other symptoms and signs involving the musculoskeletal system: Secondary | ICD-10-CM

## 2022-03-21 NOTE — Assessment & Plan Note (Signed)
Azalea also had a degenerative rupture of her extensor digiti minimized and fourth extensor tendon in the right hand for surgical reconstruction. ?She still has some difficulty with making a full fist on the right, on exam she has good motion at the MCPs and PIPs, but she has very little flexion at the DIPs. ?I think this is simply osteoarthritis and we will not really get her motion that much better, adding some home hand therapy, if this fails over 4 weeks we will do a referral to formal hand therapy. ?
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
## 2022-04-22 ENCOUNTER — Ambulatory Visit (INDEPENDENT_AMBULATORY_CARE_PROVIDER_SITE_OTHER): Payer: Medicare Other

## 2022-04-22 ENCOUNTER — Ambulatory Visit (INDEPENDENT_AMBULATORY_CARE_PROVIDER_SITE_OTHER): Payer: Medicare Other | Admitting: Sports Medicine

## 2022-04-22 DIAGNOSIS — M170 Bilateral primary osteoarthritis of knee: Secondary | ICD-10-CM

## 2022-04-22 NOTE — Progress Notes (Signed)
? ? ?  Procedures performed today:   ? ?Procedure: Real-time Ultrasound Guided injection of the left knee ?Device: Samsung HS60  ?Verbal informed consent obtained.  ?Time-out conducted.  ?Noted no overlying erythema, induration, or other signs of local infection.  ?Skin prepped in a sterile fashion.  ?Local anesthesia: Topical Ethyl chloride.  ?With sterile technique and under real time ultrasound guidance: Noted moderate effusion, 1 cc Kenalog 40, 2 cc lidocaine, 2 cc bupivacaine injected easily ?Completed without difficulty  ?Advised to call if fevers/chills, erythema, induration, drainage, or persistent bleeding.  ?Images permanently stored and available for review in PACS.  ?Impression: Technically successful ultrasound guided injection. ? ?Procedure: Real-time Ultrasound Guided injection of the right knee ?Device: Samsung HS60  ?Verbal informed consent obtained.  ?Time-out conducted.  ?Noted no overlying erythema, induration, or other signs of local infection.  ?Skin prepped in a sterile fashion.  ?Local anesthesia: Topical Ethyl chloride.  ?With sterile technique and under real time ultrasound guidance: Noted moderate effusion, 1 cc Kenalog 40, 2 cc lidocaine, 2 cc bupivacaine injected easily ?Completed without difficulty  ?Advised to call if fevers/chills, erythema, induration, drainage, or persistent bleeding.  ?Images permanently stored and available for review in PACS.  ?Impression: Technically successful ultrasound guided injection. ? ?Independent interpretation of notes and tests performed by another provider:  ? ?None. ? ?Brief History, Exam, Impression, and Recommendations:   ? ?Primary osteoarthritis of both knees ?Pleasant 88 year old female, known severe bilateral knee osteoarthritis, she is using some Tylenol, we discussed some topical modalities including icing, home conditioning, unfortunately still having severe pain, particularly medial joint line. ?Today we did bilateral knee injections, return  to see me in 6 weeks as needed for this. ? ?Chronic process with exacerbation and pharmacologic intervention ? ?___________________________________________ ?Gwen Her. Dianah Field, M.D., ABFM., CAQSM. ?Primary Care and Sports Medicine ?[place] ? ?Adjunct Instructor of Family Medicine  ?[employer] ?

## 2022-04-22 NOTE — Assessment & Plan Note (Signed)
Pleasant 88 year old female, known severe bilateral knee osteoarthritis, she is using some Tylenol, we discussed some topical modalities including icing, home conditioning, unfortunately still having severe pain, particularly medial joint line. ?Today we did bilateral knee injections, return to see me in 6 weeks as needed for this. ?

## 2022-05-06 ENCOUNTER — Encounter: Payer: Self-pay | Admitting: Family Medicine

## 2022-05-06 ENCOUNTER — Ambulatory Visit (INDEPENDENT_AMBULATORY_CARE_PROVIDER_SITE_OTHER): Payer: Medicare Other | Admitting: Family Medicine

## 2022-05-06 VITALS — BP 123/68 | HR 66 | Ht 64.0 in | Wt 139.0 lb

## 2022-05-06 DIAGNOSIS — N390 Urinary tract infection, site not specified: Secondary | ICD-10-CM

## 2022-05-06 DIAGNOSIS — I10 Essential (primary) hypertension: Secondary | ICD-10-CM | POA: Diagnosis not present

## 2022-05-06 DIAGNOSIS — I482 Chronic atrial fibrillation, unspecified: Secondary | ICD-10-CM

## 2022-05-06 NOTE — Assessment & Plan Note (Signed)
Blood pressure remains well controlled at this time.  Recommend continuation of current medications at current strength. ?

## 2022-05-06 NOTE — Progress Notes (Signed)
?Deborah Robbins - 88 y.o. female MRN [id number]  Date of birth: 23-May-1929 ? ?Subjective ?No chief complaint on file. ? ? ?HPI ?Deborah Robbins is a 88 y.o. female here today for follow up visit.  Reports that overall she is doing well at this time.  She has been seeing Dr. Dianah Field as well for management of her knee arthritis.  Recently had injections.  She has noted some improvement since following this time. ? ?Blood pressure remains well controlled with combination of lisinopril and metoprolol.  She denies any new palpitations, chest pain or shortness of breath.   ? ?She does continue to see urology due to history of recurrent UTIs.  She remains on nitrofurantoin for prevention. ? ?ROS:  A comprehensive ROS was completed and negative except as noted per HPI ? ? ? ?No Known Allergies ? ?Past Medical History:  ?Diagnosis Date  ? Anemia   ? low iron  ? Anxiety   ? Arthritis   ? Depression   ? Diverticulitis   ? Frequent urinary tract infections   ? GERD (gastroesophageal reflux disease)   ? Hypertension   ? Mitral regurgitation   ? Osteoporosis   ? PAF (paroxysmal atrial fibrillation) ([place])   ? 03/2017 admitted to [facility] with afib with RVR-->SR, discharged on diltiazem (had drop in H/H on anticoag, so d/c'd)  ? PONV (postoperative nausea and vomiting)   ? ? ?Past Surgical History:  ?Procedure Laterality Date  ? ABDOMINAL HYSTERECTOMY    ? APPENDECTOMY    ? CHOLECYSTECTOMY    ? COLONOSCOPY    ? GALLBLADDER SURGERY    ? HERNIA REPAIR    ? abdominal hernia  ? KNEE ARTHROSCOPY W/ MENISCAL REPAIR Right   ? KYPHOPLASTY    ? LUMBAR LAMINECTOMY/DECOMPRESSION MICRODISCECTOMY Left 04/09/2018  ? Procedure: LAMINECTOMY FOR FACET/SYNOVIAL CYST LUMBAR FOUR   - LUMBAR FIVE , LUMBAR FIVE  - SACRAL ONE  LEFT;  Surgeon: Ashok Pall, MD;  Location: [place];  Service: Neurosurgery;  Laterality: Left;  ? ? ?Social History  ? ?Socioeconomic History  ? Marital status: Widowed  ?  Spouse name: Not on file  ? Number of children: Not on  file  ? Years of education: Not on file  ? Highest education level: Not on file  ?Occupational History  ? Occupation: Retired  ?Tobacco Use  ? Smoking status: Former  ?  Types: Cigarettes  ?  Quit date: 12/30/1966  ?  Years since quitting: 55.3  ?  Passive exposure: Never  ? Smokeless tobacco: Never  ?Vaping Use  ? Vaping Use: Never used  ?Substance and Sexual Activity  ? Alcohol use: Never  ? Drug use: Never  ? Sexual activity: Not Currently  ?  Partners: Male  ?Other Topics Concern  ? Not on file  ?Social History Narrative  ? ** Merged History Encounter **  ?    ? ?Social Determinants of Health  ? ?Financial Resource Strain: Not on file  ?Food Insecurity: Not on file  ?Transportation Needs: Not on file  ?Physical Activity: Not on file  ?Stress: Not on file  ?Social Connections: Not on file  ? ? ?Family History  ?Problem Relation Age of Onset  ? Heart attack Neg Hx   ? Diabetes Neg Hx   ? Cancer Neg Hx   ? ? ?Health Maintenance  ?Topic Date Due  ? Zoster Vaccines- Shingrix (1 of 2) Never done  ? Pneumonia Vaccine 48+ Years old (2 - PCV) 05/30/2016  ?  COVID-19 Vaccine (5 - Booster) 05/23/2020  ? INFLUENZA VACCINE  07/30/2022  ? TETANUS/TDAP  12/24/2029  ? DEXA SCAN  Completed  ? HPV VACCINES  Aged Out  ? ? ? ?----------------------------------------------------------------------------------------------------------------------------------------------------------------------------------------------------------------- ?Physical Exam ?BP 123/68 (BP Location: Left Arm, Patient Position: Sitting, Cuff Size: Normal)   Pulse 66   Ht '5\' 4"'$  (1.626 m)   Wt 139 lb (63 kg)   SpO2 94%   BMI 23.86 kg/m?  ? ?Physical Exam ?Constitutional:   ?   Appearance: Normal appearance.  ?Eyes:  ?   General: No scleral icterus. ?Cardiovascular:  ?   Rate and Rhythm: Normal rate and regular rhythm.  ?Pulmonary:  ?   Effort: Pulmonary effort is normal.  ?   Breath sounds: Normal breath sounds.  ?Musculoskeletal:  ?   Cervical back: Neck  supple.  ?Neurological:  ?   Mental Status: She is alert.  ?Psychiatric:     ?   Mood and Affect: Mood normal.     ?   Behavior: Behavior normal.  ? ? ?------------------------------------------------------------------------------------------------------------------------------------------------------------------------------------------------------------------- ?Assessment and Plan ? ?Essential hypertension ?Blood pressure remains well controlled at this time.  Recommend continuation of current medications at current strength. ? ?Atrial fibrillation, chronic ([place]) ?Rate controlled with metoprolol at this time. ? ?Recurrent urinary tract infection ?Management per urology.  Remains on nitrofurantoin. ? ? ?No orders of the defined types were placed in this encounter. ? ? ?No follow-ups on file. ? ? ? ?This visit occurred during the SARS-CoV-2 public health emergency.  Safety protocols were in place, including screening questions prior to the visit, additional usage of staff PPE, and extensive cleaning of exam room while observing appropriate contact time as indicated for disinfecting solutions.  ? ?

## 2022-05-06 NOTE — Assessment & Plan Note (Signed)
Rate controlled with metoprolol at this time. ?

## 2022-05-06 NOTE — Assessment & Plan Note (Signed)
Management per urology.  Remains on nitrofurantoin. ?

## 2022-05-07 LAB — COMPLETE METABOLIC PANEL WITH GFR
AG Ratio: 1.3 (calc) (ref 1.0–2.5)
ALT: 9 U/L (ref 6–29)
AST: 16 U/L (ref 10–35)
Albumin: 3.7 g/dL (ref 3.6–5.1)
Alkaline phosphatase (APISO): 89 U/L (ref 37–153)
BUN/Creatinine Ratio: 20 (calc) (ref 6–22)
BUN: 20 mg/dL (ref 7–25)
CO2: 26 mmol/L (ref 20–32)
Calcium: 9.2 mg/dL (ref 8.6–10.4)
Chloride: 103 mmol/L (ref 98–110)
Creat: 0.98 mg/dL — ABNORMAL HIGH (ref 0.60–0.95)
Globulin: 2.8 g/dL (calc) (ref 1.9–3.7)
Glucose, Bld: 133 mg/dL — ABNORMAL HIGH (ref 65–99)
Potassium: 4.5 mmol/L (ref 3.5–5.3)
Sodium: 138 mmol/L (ref 135–146)
Total Bilirubin: 0.4 mg/dL (ref 0.2–1.2)
Total Protein: 6.5 g/dL (ref 6.1–8.1)
eGFR: 55 mL/min/{1.73_m2} — ABNORMAL LOW (ref >=60)

## 2022-05-07 LAB — CBC WITH DIFFERENTIAL/PLATELET
Absolute Monocytes: 851 cells/uL (ref 200–950)
Basophils Absolute: 53 cells/uL (ref 0–200)
Basophils Relative: 0.5 %
Eosinophils Absolute: 158 cells/uL (ref 15–500)
Eosinophils Relative: 1.5 %
HCT: 33.7 % — ABNORMAL LOW (ref 35.0–45.0)
Hemoglobin: 11 g/dL — ABNORMAL LOW (ref 11.7–15.5)
Lymphs Abs: 1239 cells/uL (ref 850–3900)
MCH: 26.5 pg — ABNORMAL LOW (ref 27.0–33.0)
MCHC: 32.6 g/dL (ref 32.0–36.0)
MCV: 81.2 fL (ref 80.0–100.0)
MPV: 11.2 fL (ref 7.5–12.5)
Monocytes Relative: 8.1 %
Neutro Abs: 8201 cells/uL — ABNORMAL HIGH (ref 1500–7800)
Neutrophils Relative %: 78.1 %
Platelets: 281 10*3/uL (ref 140–400)
RBC: 4.15 10*6/uL (ref 3.80–5.10)
RDW: 16.8 % — ABNORMAL HIGH (ref 11.0–15.0)
Total Lymphocyte: 11.8 %
WBC: 10.5 10*3/uL (ref 3.8–10.8)

## 2022-08-08 ENCOUNTER — Ambulatory Visit (INDEPENDENT_AMBULATORY_CARE_PROVIDER_SITE_OTHER): Payer: Medicare Other | Admitting: Sports Medicine

## 2022-08-08 ENCOUNTER — Ambulatory Visit (INDEPENDENT_AMBULATORY_CARE_PROVIDER_SITE_OTHER): Payer: Medicare Other

## 2022-08-08 DIAGNOSIS — M170 Bilateral primary osteoarthritis of knee: Secondary | ICD-10-CM | POA: Diagnosis not present

## 2022-08-08 NOTE — Progress Notes (Signed)
    Procedures performed today:    Procedure: Real-time Ultrasound Guided injection of the left knee Device: Samsung HS60  Verbal informed consent obtained.  Time-out conducted.  Noted no overlying erythema, induration, or other signs of local infection.  Skin prepped in a sterile fashion.  Local anesthesia: Topical Ethyl chloride.  With sterile technique and under real time ultrasound guidance: Noted moderate effusion, 1 cc Kenalog 40, 2 cc lidocaine, 2 cc bupivacaine injected easily Completed without difficulty  Advised to call if fevers/chills, erythema, induration, drainage, or persistent bleeding.  Images permanently stored and available for review in PACS.  Impression: Technically successful ultrasound guided injection.   Procedure: Real-time Ultrasound Guided injection of the right knee Device: Samsung HS60  Verbal informed consent obtained.  Time-out conducted.  Noted no overlying erythema, induration, or other signs of local infection.  Skin prepped in a sterile fashion.  Local anesthesia: Topical Ethyl chloride.  With sterile technique and under real time ultrasound guidance: Noted moderate effusion, 1 cc Kenalog 40, 2 cc lidocaine, 2 cc bupivacaine injected easily Completed without difficulty  Advised to call if fevers/chills, erythema, induration, drainage, or persistent bleeding.  Images permanently stored and available for review in PACS.  Impression: Technically successful ultrasound guided injection.  Independent interpretation of notes and tests performed by another provider:   None.  Brief History, Exam, Impression, and Recommendations:    Primary osteoarthritis of both knees This is a pleasant 88 year old female, known severe bilateral knee osteoarthritis with worsening pain, her last injections were in April of this year, recurrence of pain, repeat bilateral injections.    ____________________________________________ Gwen Her. Dianah Field, M.D., ABFM.,  CAQSM., AME. Primary Care and Sports Medicine [facility]  Adjunct Professor of [place] [employer] [facility] of [employer]  Risk manager

## 2022-08-08 NOTE — Assessment & Plan Note (Signed)
This is a pleasant 88 year old female, known severe bilateral knee osteoarthritis with worsening pain, her last injections were in April of this year, recurrence of pain, repeat bilateral injections.

## 2022-10-03 ENCOUNTER — Ambulatory Visit (INDEPENDENT_AMBULATORY_CARE_PROVIDER_SITE_OTHER): Payer: Medicare Other

## 2022-10-03 ENCOUNTER — Encounter: Payer: Self-pay | Admitting: Sports Medicine

## 2022-10-03 ENCOUNTER — Ambulatory Visit (INDEPENDENT_AMBULATORY_CARE_PROVIDER_SITE_OTHER): Payer: Medicare Other | Admitting: Sports Medicine

## 2022-10-03 DIAGNOSIS — S82101A Unspecified fracture of upper end of right tibia, initial encounter for closed fracture: Secondary | ICD-10-CM | POA: Insufficient documentation

## 2022-10-03 DIAGNOSIS — S82191A Other fracture of upper end of right tibia, initial encounter for closed fracture: Secondary | ICD-10-CM

## 2022-10-03 DIAGNOSIS — R0781 Pleurodynia: Secondary | ICD-10-CM | POA: Diagnosis not present

## 2022-10-03 DIAGNOSIS — S92351A Displaced fracture of fifth metatarsal bone, right foot, initial encounter for closed fracture: Secondary | ICD-10-CM

## 2022-10-03 DIAGNOSIS — W19XXXA Unspecified fall, initial encounter: Secondary | ICD-10-CM

## 2022-10-03 DIAGNOSIS — M79671 Pain in right foot: Secondary | ICD-10-CM | POA: Diagnosis not present

## 2022-10-03 DIAGNOSIS — M25561 Pain in right knee: Secondary | ICD-10-CM

## 2022-10-03 DIAGNOSIS — M8000XD Age-related osteoporosis with current pathological fracture, unspecified site, subsequent encounter for fracture with routine healing: Secondary | ICD-10-CM

## 2022-10-03 DIAGNOSIS — S2231XA Fracture of one rib, right side, initial encounter for closed fracture: Secondary | ICD-10-CM | POA: Diagnosis not present

## 2022-10-03 MED ORDER — TRAMADOL HCL 50 MG PO TABS: 50.0000 mg | ORAL_TABLET | Freq: Three times a day (TID) | ORAL | 0 refills | Status: DC | PRN

## 2022-10-03 NOTE — Assessment & Plan Note (Signed)
Last bone density test was in 2020, T score -3.3, sounds like she did not tolerate oral Fosamax and was unable to get Prolia sufficiently approved by her insurance company. At the follow-up visits we will discuss additional modalities to treat her osteoporosis including zoledronic acid infusions, Boniva infusions, Evenity, estrogen replacement (she is post hysterectomy). My preference would be estrogen replacement and zoledronic acid infusions.

## 2022-10-03 NOTE — Assessment & Plan Note (Signed)
Also noted nondisplaced medial tibial plateau fracture, adding a hinged brace, she will minimize her weightbearing with her walker.

## 2022-10-03 NOTE — Assessment & Plan Note (Signed)
Also noted nondisplaced fracture fifth metatarsal base, she will continue her postop shoe and minimize weightbearing with a walker, expect 8+ weeks for healing of all the above.

## 2022-10-03 NOTE — Progress Notes (Signed)
    Procedures performed today:    None.  Independent interpretation of notes and tests performed by another provider:   None.  Brief History, Exam, Impression, and Recommendations:    Fracture of one rib, right side, initial encounter for closed fracture Pleasant 88 year old female, recent fall, impacted right ribs, right knee and foot, she has tenderness right costal margin anterior axillary line. She also has tenderness medial joint line of the knee with good motion and strength, there is also swelling, pain, bruising over the right foot with pain specifically over the fourth and fifth metatarsal shafts. Adding tramadol for pain, rib belt. Continue postop shoe that she has already, return to see me in 3 weeks.  Update: I did personally review the x-rays, she does have a minimally displaced rib fracture on the right, we added a rib belt.  Fracture of tibia, proximal, right, closed Also noted nondisplaced medial tibial plateau fracture, adding a hinged brace, she will minimize her weightbearing with her walker.  Fracture of base of fifth metatarsal bone, right, closed, initial encounter Also noted nondisplaced fracture fifth metatarsal base, she will continue her postop shoe and minimize weightbearing with a walker, expect 8+ weeks for healing of all the above.  Age-related osteoporosis with current pathological fracture with routine healing Last bone density test was in 2020, T score -3.3, sounds like she did not tolerate oral Fosamax and was unable to get Prolia sufficiently approved by her insurance company. At the follow-up visits we will discuss additional modalities to treat her osteoporosis including zoledronic acid infusions, Boniva infusions, Evenity, estrogen replacement (she is post hysterectomy). My preference would be estrogen replacement and zoledronic acid infusions.  I spent 40 minutes of total time managing this patient today, this includes chart review, face to face,  and non-face to face time.  ____________________________________________ Gwen Her. Dianah Field, M.D., ABFM., CAQSM., AME. Primary Care and Sports Medicine [facility]  Adjunct Professor of [place] [employer] [facility] of [employer]  Risk manager

## 2022-10-03 NOTE — Assessment & Plan Note (Addendum)
Pleasant 88 year old female, recent fall, impacted right ribs, right knee and foot, she has tenderness right costal margin anterior axillary line. She also has tenderness medial joint line of the knee with good motion and strength, there is also swelling, pain, bruising over the right foot with pain specifically over the fourth and fifth metatarsal shafts. Adding tramadol for pain, rib belt. Continue postop shoe that she has already, return to see me in 3 weeks.  Update: I did personally review the x-rays, she does have a minimally displaced rib fracture on the right, we added a rib belt.

## 2022-10-24 ENCOUNTER — Encounter: Payer: Self-pay | Admitting: Sports Medicine

## 2022-10-24 ENCOUNTER — Ambulatory Visit (INDEPENDENT_AMBULATORY_CARE_PROVIDER_SITE_OTHER): Payer: Medicare Other | Admitting: Sports Medicine

## 2022-10-24 DIAGNOSIS — S2231XA Fracture of one rib, right side, initial encounter for closed fracture: Secondary | ICD-10-CM | POA: Diagnosis not present

## 2022-10-24 DIAGNOSIS — M8000XD Age-related osteoporosis with current pathological fracture, unspecified site, subsequent encounter for fracture with routine healing: Secondary | ICD-10-CM

## 2022-10-24 DIAGNOSIS — S82191D Other fracture of upper end of right tibia, subsequent encounter for closed fracture with routine healing: Secondary | ICD-10-CM | POA: Diagnosis not present

## 2022-10-24 DIAGNOSIS — Z23 Encounter for immunization: Secondary | ICD-10-CM | POA: Diagnosis not present

## 2022-10-24 DIAGNOSIS — S92351A Displaced fracture of fifth metatarsal bone, right foot, initial encounter for closed fracture: Secondary | ICD-10-CM | POA: Diagnosis not present

## 2022-10-24 MED ORDER — ESTRADIOL 1 MG PO TABS: 1.0000 mg | ORAL_TABLET | Freq: Every day | ORAL | 3 refills | Status: DC

## 2022-10-24 MED ORDER — EVENITY 105 MG/1.17ML ~~LOC~~ SOSY: 210.0000 mg | PREFILLED_SYRINGE | Freq: Once | SUBCUTANEOUS | 0 refills | Status: AC

## 2022-10-24 NOTE — Progress Notes (Signed)
    Procedures performed today:    None.  Independent interpretation of notes and tests performed by another provider:   None.  Brief History, Exam, Impression, and Recommendations:    Fracture of tibia, proximal, right, closed 3 weeks post knee injury, doing much better, weightbearing as tolerated.  Fracture of one rib, right side, initial encounter for closed fracture 3 weeks post minimally displaced rib fractures on the right, no longer needing the rib belt, minimal pain, no change in plan here.  Fracture of base of fifth metatarsal bone, right, closed, initial encounter Continue with postop shoe, 3 weeks post fall, I expect she will need it for at least 3-5 more weeks. Weightbearing as tolerated for now.  Age-related osteoporosis with current pathological fracture with routine healing Age-related osteoporosis with pathologic fractures, last bone density test was in 2020 with a T score of -3.3, she did not tolerate Fosamax, so I am hesitant to use another bisphosphonate, we were unable to get Prolia approved. Options continue to include Evenity and estrogen replacement, she is post hysterectomy. We will start estrogen replacement, PCP to continue fills, discontinue Estrace cream. I would also like to get her approved for Evenity. I would also like her to have another bone density test in 2 years.    ____________________________________________ Gwen Her. Dianah Field, M.D., ABFM., CAQSM., AME. Primary Care and Sports Medicine [place] [facility]  Adjunct Professor of [place] [employer] [facility] of Medicine  Risk manager

## 2022-10-24 NOTE — Assessment & Plan Note (Signed)
3 weeks post knee injury, doing much better, weightbearing as tolerated.

## 2022-10-24 NOTE — Assessment & Plan Note (Signed)
Age-related osteoporosis with pathologic fractures, last bone density test was in 2020 with a T score of -3.3, she did not tolerate Fosamax, so I am hesitant to use another bisphosphonate, we were unable to get Prolia approved. Options continue to include Evenity and estrogen replacement, she is post hysterectomy. We will start estrogen replacement, PCP to continue fills, discontinue Estrace cream. I would also like to get her approved for Evenity. I would also like her to have another bone density test in 2 years.

## 2022-10-24 NOTE — Assessment & Plan Note (Signed)
3 weeks post minimally displaced rib fractures on the right, no longer needing the rib belt, minimal pain, no change in plan here.

## 2022-10-24 NOTE — Assessment & Plan Note (Signed)
Continue with postop shoe, 3 weeks post fall, I expect she will need it for at least 3-5 more weeks. Weightbearing as tolerated for now.

## 2022-11-07 ENCOUNTER — Ambulatory Visit (INDEPENDENT_AMBULATORY_CARE_PROVIDER_SITE_OTHER): Payer: Medicare Other | Admitting: Family Medicine

## 2022-11-07 ENCOUNTER — Encounter: Payer: Self-pay | Admitting: Family Medicine

## 2022-11-07 VITALS — BP 151/68 | HR 86 | Ht 64.0 in | Wt 129.0 lb

## 2022-11-07 DIAGNOSIS — F411 Generalized anxiety disorder: Secondary | ICD-10-CM

## 2022-11-07 DIAGNOSIS — D649 Anemia, unspecified: Secondary | ICD-10-CM | POA: Diagnosis not present

## 2022-11-07 DIAGNOSIS — I10 Essential (primary) hypertension: Secondary | ICD-10-CM | POA: Diagnosis not present

## 2022-11-07 DIAGNOSIS — Z23 Encounter for immunization: Secondary | ICD-10-CM | POA: Diagnosis not present

## 2022-11-07 DIAGNOSIS — E559 Vitamin D deficiency, unspecified: Secondary | ICD-10-CM

## 2022-11-07 DIAGNOSIS — M8000XD Age-related osteoporosis with current pathological fracture, unspecified site, subsequent encounter for fracture with routine healing: Secondary | ICD-10-CM

## 2022-11-07 DIAGNOSIS — I482 Chronic atrial fibrillation, unspecified: Secondary | ICD-10-CM

## 2022-11-07 DIAGNOSIS — N1832 Chronic kidney disease, stage 3b: Secondary | ICD-10-CM

## 2022-11-08 ENCOUNTER — Other Ambulatory Visit: Payer: Self-pay | Admitting: Family Medicine

## 2022-11-08 DIAGNOSIS — D509 Iron deficiency anemia, unspecified: Secondary | ICD-10-CM

## 2022-11-08 LAB — COMPLETE METABOLIC PANEL WITH GFR
AG Ratio: 1.2 (calc) (ref 1.0–2.5)
ALT: 3 U/L — ABNORMAL LOW (ref 6–29)
AST: 11 U/L (ref 10–35)
Albumin: 3.7 g/dL (ref 3.6–5.1)
Alkaline phosphatase (APISO): 87 U/L (ref 37–153)
BUN/Creatinine Ratio: 16 (calc) (ref 6–22)
BUN: 15 mg/dL (ref 7–25)
CO2: 25 mmol/L (ref 20–32)
Calcium: 9 mg/dL (ref 8.6–10.4)
Chloride: 106 mmol/L (ref 98–110)
Creat: 0.96 mg/dL — ABNORMAL HIGH (ref 0.60–0.95)
Globulin: 3 g/dL (calc) (ref 1.9–3.7)
Glucose, Bld: 109 mg/dL — ABNORMAL HIGH (ref 65–99)
Potassium: 4 mmol/L (ref 3.5–5.3)
Sodium: 141 mmol/L (ref 135–146)
Total Bilirubin: 0.3 mg/dL (ref 0.2–1.2)
Total Protein: 6.7 g/dL (ref 6.1–8.1)
eGFR: 56 mL/min/{1.73_m2} — ABNORMAL LOW (ref >=60)

## 2022-11-08 LAB — CBC WITH DIFFERENTIAL/PLATELET
Absolute Monocytes: 952 cells/uL — ABNORMAL HIGH (ref 200–950)
Basophils Absolute: 48 cells/uL (ref 0–200)
Basophils Relative: 0.4 %
Eosinophils Absolute: 95 cells/uL (ref 15–500)
Eosinophils Relative: 0.8 %
HCT: 26 % — ABNORMAL LOW (ref 35.0–45.0)
Hemoglobin: 8 g/dL — ABNORMAL LOW (ref 11.7–15.5)
Lymphs Abs: 1333 cells/uL (ref 850–3900)
MCH: 25.1 pg — ABNORMAL LOW (ref 27.0–33.0)
MCHC: 30.8 g/dL — ABNORMAL LOW (ref 32.0–36.0)
MCV: 81.5 fL (ref 80.0–100.0)
MPV: 10.7 fL (ref 7.5–12.5)
Monocytes Relative: 8 %
Neutro Abs: 9472 cells/uL — ABNORMAL HIGH (ref 1500–7800)
Neutrophils Relative %: 79.6 %
Platelets: 487 10*3/uL — ABNORMAL HIGH (ref 140–400)
RBC: 3.19 10*6/uL — ABNORMAL LOW (ref 3.80–5.10)
RDW: 14.5 % (ref 11.0–15.0)
Total Lymphocyte: 11.2 %
WBC: 11.9 10*3/uL — ABNORMAL HIGH (ref 3.8–10.8)

## 2022-11-08 LAB — IRON,TIBC AND FERRITIN PANEL
%SAT: 4 % (calc) — ABNORMAL LOW (ref 16–45)
Ferritin: 19 ng/mL (ref 16–288)
Iron: 12 ug/dL — ABNORMAL LOW (ref 45–160)
TIBC: 271 mcg/dL (calc) (ref 250–450)

## 2022-11-08 LAB — TSH: TSH: 2.09 mIU/L (ref 0.40–4.50)

## 2022-11-08 LAB — VITAMIN D 25 HYDROXY (VIT D DEFICIENCY, FRACTURES): Vit D, 25-Hydroxy: 49 ng/mL (ref 30–100)

## 2022-11-10 ENCOUNTER — Encounter: Payer: Self-pay | Admitting: Family Medicine

## 2022-11-10 NOTE — Assessment & Plan Note (Signed)
Currently on estrogen replacement therapy.  We did discuss that this can increase her risk of blood clots, she would like to continue with this until she finds out whether evening it is covered.  We discussed red flags including extremity swelling or sudden shortness of breath.

## 2022-11-10 NOTE — Progress Notes (Signed)
Deborah Robbins - 88 y.o. female MRN [id number]  Date of birth: 11-23-1929  Subjective Chief Complaint  Patient presents with   Hypertension    HPI Deborah Robbins is a 88 year old female here today for follow-up visit.  She is accompanied today by her daughter.  She did have a recent fall resulting in significant injuries including fracture of her tibia, fifth metatarsal on the right and right rib.  She has no blood sugar today but does get up and ambulate some at home.  She does use a walker for minimal ambulation at this point.  She does continue in a postop shoe.  He is having management Dr. Dianah Field.  She did not tolerate Fosamax in the past for her osteoporosis.  Prolia was not approved by her insurance company.  Dr. Dianah Field did order Evenity, pending approval.  He did go ahead and start her on estrogen therapy.  Continues on diltiazem and metoprolol for management of her atrial fibrillation.  She is not currently anticoagulated.  Additionally she is on lisinopril for management of hypertension.  She denies side effects with current medication.  She denies chest pain, headaches, increased shortness of breath or vision changes.  ROS:  A comprehensive ROS was completed and negative except as noted per HPI  No Known Allergies  Past Medical History:  Diagnosis Date   Anemia    low iron   Anxiety    Arthritis    Depression    Diverticulitis    Frequent urinary tract infections    GERD (gastroesophageal reflux disease)    Hypertension    Mitral regurgitation    Osteoporosis    PAF (paroxysmal atrial fibrillation) ([place])    03/2017 admitted to [facility] with afib with RVR-->SR, discharged on diltiazem (had drop in H/H on anticoag, so d/c'd)   PONV (postoperative nausea and vomiting)     Past Surgical History:  Procedure Laterality Date   ABDOMINAL HYSTERECTOMY     APPENDECTOMY     CHOLECYSTECTOMY     COLONOSCOPY     GALLBLADDER SURGERY     HERNIA REPAIR     abdominal hernia    KNEE ARTHROSCOPY W/ MENISCAL REPAIR Right    KYPHOPLASTY     LUMBAR LAMINECTOMY/DECOMPRESSION MICRODISCECTOMY Left 04/09/2018   Procedure: LAMINECTOMY FOR FACET/SYNOVIAL CYST LUMBAR FOUR   - LUMBAR FIVE , LUMBAR FIVE  - SACRAL ONE  LEFT;  Surgeon: Ashok Pall, MD;  Location: [facility];  Service: Neurosurgery;  Laterality: Left;    Social History   Socioeconomic History   Marital status: Widowed    Spouse name: Not on file   Number of children: Not on file   Years of education: Not on file   Highest education level: Not on file  Occupational History   Occupation: Retired  Tobacco Use   Smoking status: Former    Types: Cigarettes    Quit date: 12/30/1966    Years since quitting: 55.9    Passive exposure: Never   Smokeless tobacco: Never  Vaping Use   Vaping Use: Never used  Substance and Sexual Activity   Alcohol use: Never   Drug use: Never   Sexual activity: Not Currently    Partners: Male  Other Topics Concern   Not on file  Social History Narrative   ** Merged History Encounter **       Social Determinants of Health   Financial Resource Strain: Not on file  Food Insecurity: Not on file  Transportation Needs: Not on file  Physical Activity: Not on file  Stress: Not on file  Social Connections: Not on file    Family History  Problem Relation Age of Onset   Heart attack Neg Hx    Diabetes Neg Hx    Cancer Neg Hx     Health Maintenance  Topic Date Due   COVID-19 Vaccine (5 - Pfizer risk series) 05/23/2020   Medicare Annual Wellness (AWV)  01/30/2023 (Originally 03/16/2021)   Zoster Vaccines- Shingrix (1 of 2) 02/07/2023 (Originally 08/19/1948)   TETANUS/TDAP  12/24/2029   Pneumonia Vaccine 45+ Years old  Completed   INFLUENZA VACCINE  Completed   DEXA SCAN  Completed   HPV VACCINES  Aged Out      ----------------------------------------------------------------------------------------------------------------------------------------------------------------------------------------------------------------- Physical Exam BP (!) 151/68 (BP Location: Left Arm, Patient Position: Sitting, Cuff Size: Normal)   Pulse 86   Ht '5\' 4"'$  (1.626 m)   Wt 129 lb (58.5 kg)   SpO2 94%   BMI 22.14 kg/m   Physical Exam Constitutional:      Appearance: Normal appearance.  HENT:     Head: Normocephalic and atraumatic.  Eyes:     General: No scleral icterus. Cardiovascular:     Rate and Rhythm: Normal rate and regular rhythm.  Pulmonary:     Effort: Pulmonary effort is normal.     Breath sounds: Normal breath sounds.  Musculoskeletal:     Cervical back: Neck supple.  Neurological:     Mental Status: She is alert.  Psychiatric:        Mood and Affect: Mood normal.        Behavior: Behavior normal.     ------------------------------------------------------------------------------------------------------------------------------------------------------------------------------------------------------------------- Assessment and Plan  Atrial fibrillation, chronic (HCC) Stable at this time.  She remains on diltiazem and metoprolol for management of rate related to her atrial fibrillation.  She will continue to see cardiology as well.  Essential hypertension Blood pressure stable at this time.  Recommend continuation of current medications for management of her hypertension.  Age-related osteoporosis with current pathological fracture with routine healing Currently on estrogen replacement therapy.  We did discuss that this can increase her risk of blood clots, she would like to continue with this until she finds out whether evening it is covered.  We discussed red flags including extremity swelling or sudden shortness of breath.  Chronic renal disease Update renal function today.  GAD  (generalized anxiety disorder) This remained managed by psychiatry.  Stable at this time.   No orders of the defined types were placed in this encounter.   Return in about 3 months (around 02/07/2023) for HTN/Osteoporosis.    This visit occurred during the SARS-CoV-2 public health emergency.  Safety protocols were in place, including screening questions prior to the visit, additional usage of staff PPE, and extensive cleaning of exam room while observing appropriate contact time as indicated for disinfecting solutions.

## 2022-11-10 NOTE — Assessment & Plan Note (Signed)
Update renal function today. 

## 2022-11-10 NOTE — Assessment & Plan Note (Signed)
Blood pressure stable at this time.  Recommend continuation of current medications for management of her hypertension.

## 2022-11-10 NOTE — Assessment & Plan Note (Signed)
This remained managed by psychiatry.  Stable at this time.

## 2022-11-10 NOTE — Assessment & Plan Note (Signed)
Stable at this time.  She remains on diltiazem and metoprolol for management of rate related to her atrial fibrillation.  She will continue to see cardiology as well.

## 2022-11-25 ENCOUNTER — Ambulatory Visit (INDEPENDENT_AMBULATORY_CARE_PROVIDER_SITE_OTHER): Payer: Medicare Other | Admitting: Sports Medicine

## 2022-11-25 ENCOUNTER — Encounter: Payer: Self-pay | Admitting: Sports Medicine

## 2022-11-25 DIAGNOSIS — M8000XD Age-related osteoporosis with current pathological fracture, unspecified site, subsequent encounter for fracture with routine healing: Secondary | ICD-10-CM | POA: Diagnosis not present

## 2022-11-25 MED ORDER — TYMLOS 3120 MCG/1.56ML ~~LOC~~ SOPN: 80.0000 ug | PEN_INJECTOR | Freq: Every day | SUBCUTANEOUS | 11 refills | Status: DC

## 2022-11-25 NOTE — Progress Notes (Signed)
    Procedures performed today:    None.  Independent interpretation of notes and tests performed by another provider:   None.  Brief History, Exam, Impression, and Recommendations:    Age-related osteoporosis with current pathological fracture with routine healing Currently on estrogen replacement therapy, she did not tolerate bisphosphonates, Prolia was not approved, it sounds like Evenity was denied. I will go ahead and try to get her approved for Tymlos, if this fails we will simply need to use raloxifene. I have recommended 30 minutes of walking per day with her walker or with a stationary floor bike from her couch.  Chronic process not at goal pharmacologic intervention.  ____________________________________________ Gwen Her. Dianah Field, M.D., ABFM., CAQSM., AME. Primary Care and Sports Medicine [facility] [facility]  Adjunct Professor of [place] [employer] [facility] of Medicine  Risk manager

## 2022-11-25 NOTE — Assessment & Plan Note (Signed)
Currently on estrogen replacement therapy, she did not tolerate bisphosphonates, Prolia was not approved, it sounds like Evenity was denied. I will go ahead and try to get her approved for Tymlos, if this fails we will simply need to use raloxifene. I have recommended 30 minutes of walking per day with her walker or with a stationary floor bike from her couch.

## 2022-12-03 ENCOUNTER — Telehealth: Payer: Self-pay | Admitting: General Practice

## 2022-12-03 NOTE — Telephone Encounter (Signed)
Patient scheduled for medicare wellness visit on 12/12/22.

## 2022-12-05 ENCOUNTER — Telehealth: Payer: Self-pay

## 2022-12-05 NOTE — Telephone Encounter (Signed)
She is supposed to have this rechecked again this week or next.  Can schedule a virtual visit and we can discuss if they would like.

## 2022-12-05 NOTE — Telephone Encounter (Signed)
All[place] daughter called and states they seen the kidney doctor and they were concerned with her bodywork her having anemia and I see she takes iron supplementation but they want to talk about other options please advise.

## 2022-12-06 NOTE — Telephone Encounter (Signed)
Scheduled a virtual appt for 12/09/22 @ 11:30. Patients daughter is asking for a iron infusion. Tvt

## 2022-12-09 ENCOUNTER — Other Ambulatory Visit: Payer: Self-pay | Admitting: Family

## 2022-12-09 ENCOUNTER — Telehealth: Payer: Medicare Other | Admitting: Family Medicine

## 2022-12-09 ENCOUNTER — Other Ambulatory Visit: Payer: Self-pay | Admitting: Family Medicine

## 2022-12-09 ENCOUNTER — Telehealth: Payer: Self-pay

## 2022-12-09 DIAGNOSIS — D508 Other iron deficiency anemias: Secondary | ICD-10-CM

## 2022-12-09 DIAGNOSIS — D509 Iron deficiency anemia, unspecified: Secondary | ICD-10-CM | POA: Insufficient documentation

## 2022-12-09 NOTE — Telephone Encounter (Signed)
Agree with disposition.  Concern for symptomatic anemia.

## 2022-12-09 NOTE — Telephone Encounter (Signed)
Contacted daughter concerning Ms. Ducksworth's current anemia symptoms. She advised symptoms are weakness, lethargy, dizziness, and heart flutters. She has visibly noticed labored breathing. Advised [name] to take Ms. Inboden to the ER for treatment. She's agreed.

## 2022-12-12 ENCOUNTER — Telehealth: Payer: Self-pay

## 2022-12-12 ENCOUNTER — Ambulatory Visit (INDEPENDENT_AMBULATORY_CARE_PROVIDER_SITE_OTHER): Payer: Medicare Other | Admitting: Family Medicine

## 2022-12-12 DIAGNOSIS — Z0000 Encounter for general adult medical examination without abnormal findings: Secondary | ICD-10-CM | POA: Diagnosis not present

## 2022-12-12 DIAGNOSIS — Z780 Asymptomatic menopausal state: Secondary | ICD-10-CM

## 2022-12-12 NOTE — Telephone Encounter (Signed)
Initiated Prior authorization [id number]/1.56ML pen-injectors Via: Covermymeds Case/Key:[id number]  Status: approved  as of 12/12/22 Reason:Coverage Starts on: 12/30/2021 12:00:00 AM, Coverage Ends on: 12/30/2023 Notified Pt via: Mychart

## 2022-12-12 NOTE — Patient Instructions (Signed)
[place] Maintenance Summary and Written Plan of Care  Deborah Robbins ,  Thank you for allowing me to perform your Medicare Annual Wellness Visit and for your ongoing commitment to your health.   Health Maintenance & Immunization History Health Maintenance  Topic Date Due   Zoster Vaccines- Shingrix (1 of 2) 02/07/2023 (Originally 08/19/1948)   COVID-19 Vaccine (6 - 2023-24 season) 01/02/2023   Medicare Annual Wellness (AWV)  12/13/2023   DTaP/Tdap/Td (4 - Td or Tdap) 12/24/2029   Pneumonia Vaccine 12+ Years old  Completed   INFLUENZA VACCINE  Completed   DEXA SCAN  Completed   HPV VACCINES  Aged Out   Immunization History  Administered Date(s) Administered   COVID-19, mRNA, vaccine(Comirnaty)12 years and older 11/07/2022   Fluad Quad(high Dose 65+) 11/09/2012, 10/04/2021, 10/24/2022   Influenza Split 11/09/2012, 09/30/2018, 01/13/2019   Influenza, High Dose Seasonal PF 09/08/2017, 09/08/2017, 10/26/2019, 10/23/2020   Influenza-Unspecified 11/09/2012, 09/08/2017, 09/30/2018, 01/13/2019, 10/26/2019, 10/23/2020   PFIZER Comirnaty(Gray Top)Covid-19 Tri-Sucrose Vaccine 03/08/2020, 03/28/2020   PFIZER(Purple Top)SARS-COV-2 Vaccination 03/08/2020, 03/28/2020   PNEUMOCOCCAL CONJUGATE-20 11/07/2022   Pneumococcal Polysaccharide-23 05/31/2015   Tdap 04/07/2017, 12/25/2019, 12/25/2019    These are the patient goals that we discussed:  Goals Addressed               This Visit's Progress     Patient Stated (pt-stated)        Patient stated that she would like to be able to walk good.         This is a list of Health Maintenance Items that are overdue or due now: Shingles vaccine Bone density scan  Orders/Referrals Placed Today: No orders of the defined types were placed in this encounter.  (Contact our referral department at [phone] if you have not spoken with someone about your referral appointment within the next 5 days)    Follow-up  Plan Follow-up with Luetta Nutting, DO as planned Schedule shingles vaccine at the pharmacy. Medicare wellness visit in one year. AVS printed and mailed to the patient.      Health Maintenance, Female Adopting a healthy lifestyle and getting preventive care are important in promoting health and wellness. Ask your health care provider about: The right schedule for you to have regular tests and exams. Things you can do on your own to prevent diseases and keep yourself healthy. What should I know about diet, weight, and exercise? Eat a healthy diet  Eat a diet that includes plenty of vegetables, fruits, low-fat dairy products, and lean protein. Do not eat a lot of foods that are high in solid fats, added sugars, or sodium. Maintain a healthy weight Body mass index (BMI) is used to identify weight problems. It estimates body fat based on height and weight. Your health care provider can help determine your BMI and help you achieve or maintain a healthy weight. Get regular exercise Get regular exercise. This is one of the most important things you can do for your health. Most adults should: Exercise for at least 150 minutes each week. The exercise should increase your heart rate and make you sweat (moderate-intensity exercise). Do strengthening exercises at least twice a week. This is in addition to the moderate-intensity exercise. Spend less time sitting. Even light physical activity can be beneficial. Watch cholesterol and blood lipids Have your blood tested for lipids and cholesterol at 86 years of age, then have this test every 5 years. Have your cholesterol levels checked more often if: Your  lipid or cholesterol levels are high. You are older than 86 years of age. You are at high risk for heart disease. What should I know about cancer screening? Depending on your health history and family history, you may need to have cancer screening at various ages. This may include screening  for: Breast cancer. Cervical cancer. Colorectal cancer. Skin cancer. Lung cancer. What should I know about heart disease, diabetes, and high blood pressure? Blood pressure and heart disease High blood pressure causes heart disease and increases the risk of stroke. This is more likely to develop in people who have high blood pressure readings or are overweight. Have your blood pressure checked: Every 3-5 years if you are 43-82 years of age. Every year if you are 35 years old or older. Diabetes Have regular diabetes screenings. This checks your fasting blood sugar level. Have the screening done: Once every three years after age 2 if you are at a normal weight and have a low risk for diabetes. More often and at a younger age if you are overweight or have a high risk for diabetes. What should I know about preventing infection? Hepatitis B If you have a higher risk for hepatitis B, you should be screened for this virus. Talk with your health care provider to find out if you are at risk for hepatitis B infection. Hepatitis C Testing is recommended for: Everyone born from [birth year] through 1963. Anyone with known risk factors for hepatitis C. Sexually transmitted infections (STIs) Get screened for STIs, including gonorrhea and chlamydia, if: You are sexually active and are younger than 86 years of age. You are older than 86 years of age and your health care provider tells you that you are at risk for this type of infection. Your sexual activity has changed since you were last screened, and you are at increased risk for chlamydia or gonorrhea. Ask your health care provider if you are at risk. Ask your health care provider about whether you are at high risk for HIV. Your health care provider may recommend a prescription medicine to help prevent HIV infection. If you choose to take medicine to prevent HIV, you should first get tested for HIV. You should then be tested every 3 months for as long as you  are taking the medicine. Pregnancy If you are about to stop having your period (premenopausal) and you may become pregnant, seek counseling before you get pregnant. Take 400 to 800 micrograms (mcg) of folic acid every day if you become pregnant. Ask for birth control (contraception) if you want to prevent pregnancy. Osteoporosis and menopause Osteoporosis is a disease in which the bones lose minerals and strength with aging. This can result in bone fractures. If you are 64 years old or older, or if you are at risk for osteoporosis and fractures, ask your health care provider if you should: Be screened for bone loss. Take a calcium or vitamin D supplement to lower your risk of fractures. Be given hormone replacement therapy (HRT) to treat symptoms of menopause. Follow these instructions at home: Alcohol use Do not drink alcohol if: Your health care provider tells you not to drink. You are pregnant, may be pregnant, or are planning to become pregnant. If you drink alcohol: Limit how much you have to: 0-1 drink a day. Know how much alcohol is in your drink. In the [place] one drink equals one 12 oz bottle of beer (355 mL), one 5 oz glass of wine (148 mL), or one 1  oz glass of hard liquor (44 mL). Lifestyle Do not use any products that contain nicotine or tobacco. These products include cigarettes, chewing tobacco, and vaping devices, such as e-cigarettes. If you need help quitting, ask your health care provider. Do not use street drugs. Do not share needles. Ask your health care provider for help if you need support or information about quitting drugs. General instructions Schedule regular health, dental, and eye exams. Stay current with your vaccines. Tell your health care provider if: You often feel depressed. You have ever been abused or do not feel safe at home. Summary Adopting a healthy lifestyle and getting preventive care are important in promoting health and wellness. Follow your  health care provider's instructions about healthy diet, exercising, and getting tested or screened for diseases. Follow your health care provider's instructions on monitoring your cholesterol and blood pressure. This information is not intended to replace advice given to you by your health care provider. Make sure you discuss any questions you have with your health care provider. Document Revised: 05/07/2021 Document Reviewed: 05/07/2021 Elsevier Patient Education  [place].

## 2022-12-12 NOTE — Progress Notes (Signed)
MEDICARE ANNUAL WELLNESS VISIT  12/12/2022  Telephone Visit Disclaimer This Medicare AWV was conducted by telephone due to national recommendations for restrictions regarding the COVID-19 Pandemic (e.g. social distancing).  I verified, using two identifiers, that I am speaking with Deborah Robbins or their authorized healthcare agent. I discussed the limitations, risks, security, and privacy concerns of performing an evaluation and management service by telephone and the potential availability of an in-person appointment in the future. The patient expressed understanding and agreed to proceed.  Location of Patient: Home with Daughter, [name] Location of Provider (nurse):  In the office.  Subjective:    Deborah Robbins is a 88 y.o. female patient of Luetta Nutting, DO who had a Medicare Annual Wellness Visit today via telephone. [name] is Retired and lives with their daughter. she had 6 children, 4 have deceased. she reports that she is socially active and does interact with friends/family regularly. she is minimally physically active and enjoys working on word search and reading.  Patient Care Team: Luetta Nutting, DO as PCP - General (Family Medicine) Silverio Decamp, MD as Consulting Physician (Sports Medicine) Alesia Banda as Physician Assistant (Urology) Larene Beach, MD (Family Medicine)     12/12/2022    9:24 AM 04/09/2018    8:20 PM 04/09/2018   11:58 AM  Advanced Directives  Does Patient Have a Medical Advance Directive? Yes Yes Yes  Type of Advance Directive Living will Healthcare Power of [place];Living will  Does patient want to make changes to medical advance directive? No - Patient declined No - Patient declined   Copy of [place] in Chart?  Yes Yes    Hospital Utilization Over the Past 12 Months: # of hospitalizations or ER visits: 1 # of surgeries: 0  Review of Systems    Patient reports  that her overall health is unchanged compared to last year.  History obtained from chart review and the patient  Patient Reported Readings (BP, Pulse, CBG, Weight, etc) none  Pain Assessment Pain : No/denies pain     Current Medications & Allergies (verified) Allergies as of 12/12/2022   No Known Allergies      Medication List        Accurate as of December 12, 2022  9:39 AM. If you have any questions, ask your nurse or doctor.          ALPRAZolam 0.5 MG tablet Commonly known as: XANAX Take 0.5 mg by mouth 3 (three) times daily as needed for anxiety.   Biotin 1000 MCG tablet Take 2,000 mcg by mouth daily.   BIOTIN PO Take by mouth.   busPIRone 10 MG tablet Commonly known as: BUSPAR Take 10 mg by mouth 2 (two) times daily.   cyanocobalamin 1000 MCG tablet Commonly known as: VITAMIN B12 Take 1,000 mcg by mouth daily.   cyclobenzaprine 10 MG tablet Commonly known as: FLEXERIL Take 1 tablet (10 mg total) by mouth 3 (three) times daily as needed for muscle spasms.   diltiazem 120 MG 24 hr capsule Commonly known as: CARDIZEM CD Take 120 mg by mouth daily.   ferrous sulfate 325 (65 FE) MG tablet Take 325 mg by mouth 2 (two) times daily with a meal.   gabapentin 300 MG capsule Commonly known as: NEURONTIN Take 1 capsule (300 mg total) by mouth 3 (three) times daily.   lisinopril 10 MG tablet Commonly known as: ZESTRIL Take 10 mg by mouth in the morning and  at bedtime.   metoprolol succinate 25 MG 24 hr tablet Commonly known as: TOPROL-XL TAKE 1/2 (ONE-HALF) TABLET BY MOUTH ONCE DAILY   mupirocin ointment 2 % Commonly known as: BACTROBAN Apply topically.   nitrofurantoin 100 MG capsule Commonly known as: MACRODANTIN Take by mouth.   omeprazole 20 MG capsule Commonly known as: PRILOSEC Take 20 mg by mouth daily.   PreserVision AREDS 2 Chew Chew 1 tablet by mouth 2 (two) times daily.   senna 8.6 MG tablet Commonly known as: SENOKOT Take by  mouth.   traMADol 50 MG tablet Commonly known as: ULTRAM Take 1 tablet (50 mg total) by mouth every 8 (eight) hours as needed for moderate pain.   Tymlos 3120 MCG/1.56ML Sopn Generic drug: Abaloparatide Inject 80 mcg into the skin daily.   Vitamin D3 50 MCG (2000 UT) capsule Take 2,000 Units by mouth daily.   D3-1000 PO Take by mouth.        History (reviewed): Past Medical History:  Diagnosis Date   Anemia    low iron   Anxiety    Arthritis    Depression    Diverticulitis    Frequent urinary tract infections    GERD (gastroesophageal reflux disease)    Hypertension    Mitral regurgitation    Osteoporosis    PAF (paroxysmal atrial fibrillation) ([place])    03/2017 admitted to [facility] with afib with RVR-->SR, discharged on diltiazem (had drop in H/H on anticoag, so d/c'd)   PONV (postoperative nausea and vomiting)    Past Surgical History:  Procedure Laterality Date   ABDOMINAL HYSTERECTOMY     APPENDECTOMY     CHOLECYSTECTOMY     COLONOSCOPY     GALLBLADDER SURGERY     HERNIA REPAIR     abdominal hernia   KNEE ARTHROSCOPY W/ MENISCAL REPAIR Right    KYPHOPLASTY     LUMBAR LAMINECTOMY/DECOMPRESSION MICRODISCECTOMY Left 04/09/2018   Procedure: LAMINECTOMY FOR FACET/SYNOVIAL CYST LUMBAR FOUR   - LUMBAR FIVE , LUMBAR FIVE  - SACRAL ONE  LEFT;  Surgeon: Ashok Pall, MD;  Location: [facility];  Service: Neurosurgery;  Laterality: Left;   Family History  Problem Relation Age of Onset   Heart attack Neg Hx    Diabetes Neg Hx    Cancer Neg Hx    Social History   Socioeconomic History   Marital status: Widowed    Spouse name: Not on file   Number of children: 6   Years of education: 55   Highest education level: 12th grade  Occupational History   Occupation: Retired  Tobacco Use   Smoking status: Former    Types: Cigarettes    Quit date: 12/30/1966    Years since quitting: 55.9    Passive exposure: Never   Smokeless tobacco: Never  Vaping Use   Vaping Use:  Never used  Substance and Sexual Activity   Alcohol use: Never   Drug use: Never   Sexual activity: Not Currently    Partners: Male  Other Topics Concern   Not on file  Social History Narrative   Her daughter lives with her. She had six children but four are deceased. She enjoys working on Loss adjuster, chartered and reading.   Social Determinants of Health   Financial Resource Strain: Low Risk  (12/12/2022)   Overall Financial Resource Strain (CARDIA)    Difficulty of Paying Living Expenses: Not hard at all  Food Insecurity: No Food Insecurity (12/12/2022)   Hunger Vital Sign  Worried About Charity fundraiser in the Last Year: Never true    [place] in the Last Year: Never true  Transportation Needs: No Transportation Needs (12/12/2022)   PRAPARE - Hydrologist (Medical): No    Lack of Transportation (Non-Medical): No  Physical Activity: Inactive (12/12/2022)   Exercise Vital Sign    Days of Exercise per Week: 0 days    Minutes of Exercise per Session: 0 min  Stress: No Stress Concern Present (12/12/2022)   [place]    Feeling of Stress : Not at all  Social Connections: Socially Isolated (12/12/2022)   Social Connection and Isolation Panel [NHANES]    Frequency of Communication with Friends and Family: More than three times a week    Frequency of Social Gatherings with Friends and Family: More than three times a week    Attends Religious Services: Never    Marine scientist or Organizations: No    Attends Archivist Meetings: Never    Marital Status: Widowed    Activities of Daily Living    12/12/2022    9:30 AM  In your present state of health, do you have any difficulty performing the following activities:  Hearing? 0  Vision? 1  Comment hx of macular degeneration  Difficulty concentrating or making decisions? 0  Walking or climbing stairs? 1  Comment her  daughter helps  Dressing or bathing? 1  Comment her daughter helps  Doing errands, shopping? 1  Comment her daughter helps  Conservation officer, nature and eating ? Y  Comment her daughter helps  Using the Toilet? Y  Comment her daughter helps  In the past six months, have you accidently leaked urine? Y  Comment she wears a pad  Do you have problems with loss of bowel control? N  Managing your Medications? Y  Comment her daughter helps  Managing your Finances? Y  Comment her daughter helps  Housekeeping or managing your Housekeeping? Y  Comment her daughter helps    Patient Education/ Literacy How often do you need to have someone help you when you read instructions, pamphlets, or other written materials from your doctor or pharmacy?: 1 - Never What is the last grade level you completed in school?: 12th grade  Exercise Current Exercise Habits: The patient does not participate in regular exercise at present, Exercise limited by: orthopedic condition(s)  Diet Patient reports consuming 3 meals a day and 3 snack(s) a day Patient reports that her primary diet is: Regular Patient reports that she does have regular access to food.   Depression Screen    12/12/2022    9:24 AM 11/07/2022    2:04 PM 05/06/2022    1:50 PM 09/08/2017   11:06 AM  PHQ 2/9 Scores  PHQ - 2 Score 1 0 0 2     Fall Risk    12/12/2022    9:24 AM 11/07/2022    2:04 PM 05/06/2022    1:50 PM 10/14/2017   10:44 AM  Fall Risk   Falls in the past year? 1 0 0 Yes  Number falls in past yr: 1 0 0 2 or more  Injury with Fall? 1 0 0   Risk for fall due to : History of fall(s);Impaired mobility;Impaired balance/gait No Fall Risks No Fall Risks   Follow up Falls evaluation completed;Education provided;Falls prevention discussed Falls evaluation completed Falls evaluation completed  Objective:  Deborah Robbins seemed alert and oriented and she participated appropriately during our telephone visit.  Blood Pressure Weight  BMI  BP Readings from Last 3 Encounters:  11/07/22 (!) 151/68  05/06/22 123/68  01/30/22 (!) 150/76   Wt Readings from Last 3 Encounters:  11/07/22 129 lb (58.5 kg)  10/03/22 139 lb (63 kg)  05/06/22 139 lb (63 kg)   BMI Readings from Last 1 Encounters:  11/07/22 22.14 kg/m    *Unable to obtain current vital signs, weight, and BMI due to telephone visit type  Hearing/Vision  Nalaya did not seem to have difficulty with hearing/understanding during the telephone conversation Reports that she has had a formal eye exam by an eye care professional within the past year Reports that she has not had a formal hearing evaluation within the past year *Unable to fully assess hearing and vision during telephone visit type  Cognitive Function:    12/12/2022    9:33 AM  6CIT Screen  What Year? 0 points  What month? 0 points  What time? 0 points  Count back from 20 0 points  Months in reverse 0 points  Repeat phrase 2 points  Total Score 2 points   (Normal:0-7, Significant for Dysfunction: >8)  Normal Cognitive Function Screening: Yes   Immunization & Health Maintenance Record Immunization History  Administered Date(s) Administered   COVID-19, mRNA, vaccine(Comirnaty)12 years and older 11/07/2022   Fluad Quad(high Dose 65+) 11/09/2012, 10/04/2021, 10/24/2022   Influenza Split 11/09/2012, 09/30/2018, 01/13/2019   Influenza, High Dose Seasonal PF 09/08/2017, 09/08/2017, 10/26/2019, 10/23/2020   Influenza-Unspecified 11/09/2012, 09/08/2017, 09/30/2018, 01/13/2019, 10/26/2019, 10/23/2020   PFIZER Comirnaty(Gray Top)Covid-19 Tri-Sucrose Vaccine 03/08/2020, 03/28/2020   PFIZER(Purple Top)SARS-COV-2 Vaccination 03/08/2020, 03/28/2020   PNEUMOCOCCAL CONJUGATE-20 11/07/2022   Pneumococcal Polysaccharide-23 05/31/2015   Tdap 04/07/2017, 12/25/2019, 12/25/2019    Health Maintenance  Topic Date Due   Zoster Vaccines- Shingrix (1 of 2) 02/07/2023 (Originally 08/19/1948)   COVID-19 Vaccine  (6 - 2023-24 season) 01/02/2023   Medicare Annual Wellness (AWV)  12/13/2023   DTaP/Tdap/Td (4 - Td or Tdap) 12/24/2029   Pneumonia Vaccine 25+ Years old  Completed   INFLUENZA VACCINE  Completed   DEXA SCAN  Completed   HPV VACCINES  Aged Out       Assessment  This is a routine wellness examination for [employer]  Health Maintenance: Due or Overdue There are no preventive care reminders to display for this patient.  Deborah Robbins does not need a referral for Community Assistance: Care Management:   no Social Work:    no Prescription Assistance:  no Nutrition/Diabetes Education:  no   Plan:  Personalized Goals  Goals Addressed               This Visit's Progress     Patient Stated (pt-stated)        Patient stated that she would like to be able to walk good.       Personalized Health Maintenance & Screening Recommendations  Shingles vaccine Bone density scan  Lung Cancer Screening Recommended: no (Low Dose CT Chest recommended if Age 72-80 years, 30 pack-year currently smoking OR have quit w/in past 15 years) Hepatitis C Screening recommended: no HIV Screening recommended: no  Advanced Directives: Written information was not prepared per patient's request.  Referrals & Orders Orders Placed This Encounter  Procedures   [place]    Follow-up Plan Follow-up with Luetta Nutting, DO as planned Schedule shingles vaccine at the pharmacy. Medicare wellness visit in  one year. AVS printed and mailed to the patient.   I have personally reviewed and noted the following in the patient's chart:   Medical and social history Use of alcohol, tobacco or illicit drugs  Current medications and supplements Functional ability and status Nutritional status Physical activity Advanced directives List of other physicians Hospitalizations, surgeries, and ER visits in previous 12 months Vitals Screenings to include cognitive, depression, and falls Referrals and  appointments  In addition, I have reviewed and discussed with Deborah Robbins certain preventive protocols, quality metrics, and best practice recommendations. A written personalized care plan for preventive services as well as general preventive health recommendations is available and can be mailed to the patient at her request.      Tinnie Gens, RN BSN  12/12/2022

## 2022-12-16 ENCOUNTER — Other Ambulatory Visit: Payer: Self-pay | Admitting: Family

## 2022-12-16 DIAGNOSIS — D649 Anemia, unspecified: Secondary | ICD-10-CM

## 2022-12-17 ENCOUNTER — Inpatient Hospital Stay (HOSPITAL_BASED_OUTPATIENT_CLINIC_OR_DEPARTMENT_OTHER): Payer: Medicare Other | Admitting: Family

## 2022-12-17 ENCOUNTER — Encounter: Payer: Self-pay | Admitting: Family

## 2022-12-17 ENCOUNTER — Inpatient Hospital Stay: Payer: Medicare Other | Attending: Hematology & Oncology

## 2022-12-17 ENCOUNTER — Inpatient Hospital Stay: Payer: Medicare Other

## 2022-12-17 VITALS — BP 159/59 | HR 69

## 2022-12-17 VITALS — BP 164/72 | HR 92 | Temp 97.8°F | Resp 18

## 2022-12-17 DIAGNOSIS — Z79899 Other long term (current) drug therapy: Secondary | ICD-10-CM | POA: Diagnosis not present

## 2022-12-17 DIAGNOSIS — D509 Iron deficiency anemia, unspecified: Secondary | ICD-10-CM | POA: Diagnosis present

## 2022-12-17 DIAGNOSIS — D649 Anemia, unspecified: Secondary | ICD-10-CM

## 2022-12-17 LAB — SAMPLE TO BLOOD BANK

## 2022-12-17 LAB — CMP (CANCER CENTER ONLY)
ALT: <5 U/L (ref 0–44)
AST: 12 U/L — ABNORMAL LOW (ref 15–41)
Albumin: 3.7 g/dL (ref 3.5–5.0)
Alkaline Phosphatase: 68 U/L (ref 38–126)
Anion gap: 11 (ref 5–15)
BUN: 14 mg/dL (ref 8–23)
CO2: 24 mmol/L (ref 22–32)
Calcium: 8.8 mg/dL — ABNORMAL LOW (ref 8.9–10.3)
Chloride: 106 mmol/L (ref 98–111)
Creatinine: 0.76 mg/dL (ref 0.44–1.00)
GFR, Estimated: >60 mL/min (ref >60)
Glucose, Bld: 116 mg/dL — ABNORMAL HIGH (ref 70–99)
Potassium: 3.1 mmol/L — ABNORMAL LOW (ref 3.5–5.1)
Sodium: 141 mmol/L (ref 135–145)
Total Bilirubin: 0.5 mg/dL (ref 0.3–1.2)
Total Protein: 6.6 g/dL (ref 6.5–8.1)

## 2022-12-17 LAB — CBC WITH DIFFERENTIAL (CANCER CENTER ONLY)
Abs Immature Granulocytes: 0.04 10*3/uL (ref 0.00–0.07)
Basophils Absolute: 0 10*3/uL (ref 0.0–0.1)
Basophils Relative: 1 %
Eosinophils Absolute: 0.2 10*3/uL (ref 0.0–0.5)
Eosinophils Relative: 2 %
HCT: 27.3 % — ABNORMAL LOW (ref 36.0–46.0)
Hemoglobin: 7.8 g/dL — ABNORMAL LOW (ref 12.0–15.0)
Immature Granulocytes: 1 %
Lymphocytes Relative: 13 %
Lymphs Abs: 1.1 10*3/uL (ref 0.7–4.0)
MCH: 21.7 pg — ABNORMAL LOW (ref 26.0–34.0)
MCHC: 28.6 g/dL — ABNORMAL LOW (ref 30.0–36.0)
MCV: 75.8 fL — ABNORMAL LOW (ref 80.0–100.0)
Monocytes Absolute: 0.7 10*3/uL (ref 0.1–1.0)
Monocytes Relative: 8 %
Neutro Abs: 6.4 10*3/uL (ref 1.7–7.7)
Neutrophils Relative %: 75 %
Platelet Count: 401 10*3/uL — ABNORMAL HIGH (ref 150–400)
RBC: 3.6 MIL/uL — ABNORMAL LOW (ref 3.87–5.11)
RDW: 17.9 % — ABNORMAL HIGH (ref 11.5–15.5)
WBC Count: 8.4 10*3/uL (ref 4.0–10.5)
nRBC: 0 % (ref 0.0–0.2)

## 2022-12-17 LAB — RETICULOCYTES
Immature Retic Fract: 13.9 % (ref 2.3–15.9)
RBC.: 3.62 MIL/uL — ABNORMAL LOW (ref 3.87–5.11)
Retic Count, Absolute: 46.7 10*3/uL (ref 19.0–186.0)
Retic Ct Pct: 1.3 % (ref 0.4–3.1)

## 2022-12-17 LAB — FERRITIN: Ferritin: 11 ng/mL (ref 11–307)

## 2022-12-17 LAB — LACTATE DEHYDROGENASE: LDH: 135 U/L (ref 98–192)

## 2022-12-17 LAB — IRON AND IRON BINDING CAPACITY (CC-WL,HP ONLY)
Iron: 16 ug/dL — ABNORMAL LOW (ref 28–170)
Saturation Ratios: 5 % — ABNORMAL LOW (ref 10.4–31.8)
TIBC: 314 ug/dL (ref 250–450)
UIBC: 298 ug/dL (ref 148–442)

## 2022-12-17 LAB — SAVE SMEAR(SSMR), FOR PROVIDER SLIDE REVIEW

## 2022-12-17 MED ORDER — SODIUM CHLORIDE 0.9 % IV SOLN: Freq: Once | INTRAVENOUS | Status: AC

## 2022-12-17 MED ORDER — SODIUM CHLORIDE 0.9 % IV SOLN
1000.0000 mg | Freq: Once | INTRAVENOUS | Status: AC
  Administered 2022-12-17: 1000 mg via INTRAVENOUS
  Filled 2022-12-17: qty 10

## 2022-12-17 NOTE — Patient Instructions (Signed)

## 2022-12-17 NOTE — Progress Notes (Addendum)
Hematology/Oncology Consultation   Name: Cartha Rotert      MRN: [id number]    Location: Room/bed info not found  Date: 12/17/2022 Time:11:26 AM   REFERRING PHYSICIAN:  Luetta Nutting, DO  REASON FOR CONSULT: Other iron deficiency anemia    DIAGNOSIS: Iron deficiency anemia   Of Note: Patient is Jehovah's Witness, no blood products  HISTORY OF PRESENT ILLNESS: Ms. Cove is a very pleasant 88 yo caucasian female with history of anemia. 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  Last month her iron saturation was only 4% and ferritin 19.  She notes fatigue and weakness. She states that she is usually active and enjoys walking and getting out for exercise.  She had a fall in October and ended up with a fractured right 8th rib and fractured 5th metatarsal base  She denies noting any blood loss. No petechiae. Her skin is thin and she does bruise easily on her arms and legs.  She has history of atrial fib but stopped her anticoagulant years ago due to anemia.  She has history of diverticulitis.  No fever, chills, n/v, cough, rash, dizziness, SOB, chest pain, palpitations, abdominal pain or changes in bowel or bladder habits.  She has chronic constipation.  No history of diabetes or thyroid disease.  No personal history of cancer. Family history includes: brother - esophageal, brother - prostate, sister - brain, son - metastatic lung and grandson with lymphoma.  No issue with frequent or recurrent infections. No fever, chills, n/v, cough, rash, dizziness, SOB, chest pain, palpitations, abdominal pain or changes in bowel or bladder habits.  No tenderness, numbness or tingling in her extremities.  She has chronic puffiness in her feet and ankles unchanged from baseline.  She states that her appetite is decreased over the last month or so. She hydrates well throughout the day. She did not stand for a weight today.  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   ROS: All other 10 point review of systems is negative.   PAST MEDICAL HISTORY:   Past Medical History:  Diagnosis Date   Anemia    low iron   Anxiety    Arthritis    Depression    Diverticulitis    Frequent urinary tract infections    GERD (gastroesophageal reflux disease)    Hypertension    Mitral regurgitation    Osteoporosis    PAF (paroxysmal atrial fibrillation) ([place])    03/2017 admitted to [facility] with afib with RVR-->SR, discharged on diltiazem (had drop in H/H on anticoag, so d/c'd)   PONV (postoperative nausea and vomiting)     ALLERGIES: No Known Allergies    MEDICATIONS:  Current Outpatient Medications on File Prior to Visit  Medication Sig Dispense Refill   ALPRAZolam (XANAX) 0.5 MG tablet Take 0.5 mg by mouth 3 (three) times daily as needed for anxiety.      Biotin 1000 MCG tablet Take 2,000 mcg by mouth daily.      BIOTIN PO Take by mouth.     busPIRone (BUSPAR) 10 MG tablet Take 10 mg by mouth 2 (two) times daily.     Cholecalciferol (D3-1000 PO) Take by mouth.     Cholecalciferol (VITAMIN D3) 2000 units capsule Take 2,000 Units by mouth daily.      diltiazem (CARDIZEM CD) 120 MG 24 hr capsule Take 120 mg by mouth daily.      ferrous sulfate 325 (65 FE) MG tablet  Take 325 mg by mouth 2 (two) times daily with a meal.      lisinopril (ZESTRIL) 10 MG tablet Take 10 mg by mouth in the morning and at bedtime.     metoprolol succinate (TOPROL-XL) 25 MG 24 hr tablet TAKE 1/2 (ONE-HALF) TABLET BY MOUTH ONCE DAILY     Multiple Vitamins-Minerals (PRESERVISION AREDS 2) CHEW Chew 1 tablet by mouth 2 (two) times daily.     mupirocin ointment (BACTROBAN) 2 % Apply topically.     nitrofurantoin (MACRODANTIN) 100 MG capsule Take by mouth.     omeprazole (PRILOSEC) 20 MG capsule Take 20 mg by mouth daily.      senna (SENOKOT) 8.6 MG tablet Take by mouth.     vitamin B-12 (CYANOCOBALAMIN) 1000 MCG tablet Take 1,000 mcg by mouth daily.      Abaloparatide  (TYMLOS) 3120 MCG/1.56ML SOPN Inject 80 mcg into the skin daily. (Patient not taking: Reported on 12/17/2022) 1.56 mL 11   cyclobenzaprine (FLEXERIL) 10 MG tablet Take 1 tablet (10 mg total) by mouth 3 (three) times daily as needed for muscle spasms. (Patient not taking: Reported on 12/17/2022) 90 tablet 2   gabapentin (NEURONTIN) 300 MG capsule Take 1 capsule (300 mg total) by mouth 3 (three) times daily. (Patient not taking: Reported on 12/17/2022) 270 capsule 1   traMADol (ULTRAM) 50 MG tablet Take 1 tablet (50 mg total) by mouth every 8 (eight) hours as needed for moderate pain. (Patient not taking: Reported on 12/17/2022) 21 tablet 0   No current facility-administered medications on file prior to visit.     PAST SURGICAL HISTORY Past Surgical History:  Procedure Laterality Date   ABDOMINAL HYSTERECTOMY     APPENDECTOMY     CHOLECYSTECTOMY     COLONOSCOPY     GALLBLADDER SURGERY     HERNIA REPAIR     abdominal hernia   KNEE ARTHROSCOPY W/ MENISCAL REPAIR Right    KYPHOPLASTY     LUMBAR LAMINECTOMY/DECOMPRESSION MICRODISCECTOMY Left 04/09/2018   Procedure: LAMINECTOMY FOR FACET/SYNOVIAL CYST LUMBAR FOUR   - LUMBAR FIVE , LUMBAR FIVE  - SACRAL ONE  LEFT;  Surgeon: Ashok Pall, MD;  Location: [facility];  Service: Neurosurgery;  Laterality: Left;    FAMILY HISTORY: Family History  Problem Relation Age of Onset   Heart attack Neg Hx    Diabetes Neg Hx    Cancer Neg Hx     SOCIAL HISTORY:  reports that she quit smoking about 56 years ago. Her smoking use included cigarettes. She has never been exposed to tobacco smoke. She has never used smokeless tobacco. She reports that she does not drink alcohol and does not use drugs.  PERFORMANCE STATUS: The patient's performance status is 1 - Symptomatic but completely ambulatory  PHYSICAL EXAM: Most Recent Vital Signs: Blood pressure (!) 164/72, pulse 92, temperature 97.8 F (36.6 C), temperature source Oral, resp. rate 18, SpO2 95 %. BP  (!) 164/72 (BP Location: Left Arm, Patient Position: Sitting)   Pulse 92   Temp 97.8 F (36.6 C) (Oral)   Resp 18   SpO2 95%   General Appearance:    Alert, cooperative, no distress, appears stated age  Head:    Normocephalic, without obvious abnormality, atraumatic  Eyes:    PERRL, conjunctiva/corneas clear, EOM's intact, fundi    benign, both eyes             Throat:   Lips, mucosa, and tongue normal; teeth and gums normal  Neck:  Supple, symmetrical, trachea midline, no adenopathy;       thyroid:  No enlargement/tenderness/nodules; no carotid   bruit or JVD  Back:     Symmetric, no curvature, ROM normal, no CVA tenderness  Lungs:     Clear to auscultation bilaterally, respirations unlabored  Chest wall:    No tenderness or deformity  Heart:    Regular rate and rhythm, S1 and S2 normal, no murmur, rub   or gallop  Abdomen:     Soft, non-tender, bowel sounds active all four quadrants,    no masses, no organomegaly        Extremities:   Extremities normal, atraumatic, no cyanosis or edema  Pulses:   2+ and symmetric all extremities  Skin:   Skin color, texture, turgor normal, no rashes or lesions  Lymph nodes:   Cervical, supraclavicular, and axillary nodes normal  Neurologic:   CNII-XII intact. Weakness and age related kyphosis, sensation and reflexes      throughout    LABORATORY DATA:  Results for orders placed or performed in visit on 12/17/22 (from the past 48 hour(s))  Save Smear for Provider Slide Review     Status: None   Collection Time: 12/17/22 10:42 AM  Result Value Ref Range   Smear Review SMEAR STAINED AND AVAILABLE FOR REVIEW     Comment: Performed at [facility] at [facility], [address], [place], [place] [address]  CBC with Differential ([place] Only)     Status: Abnormal   Collection Time: 12/17/22 10:42 AM  Result Value Ref Range   WBC Count 8.4 4.0 - 10.5 K/uL   RBC 3.60 (L) 3.87 - 5.11 MIL/uL   Hemoglobin 7.8  (L) 12.0 - 15.0 g/dL    Comment: Reticulocyte Hemoglobin testing may be clinically indicated, consider ordering this additional test [id number]    HCT 27.3 (L) 36.0 - 46.0 %   MCV 75.8 (L) 80.0 - 100.0 fL   MCH 21.7 (L) 26.0 - 34.0 pg   MCHC 28.6 (L) 30.0 - 36.0 g/dL   RDW 17.9 (H) 11.5 - 15.5 %   Platelet Count 401 (H) 150 - 400 K/uL   nRBC 0.0 0.0 - 0.2 %   Neutrophils Relative % 75 %   Neutro Abs 6.4 1.7 - 7.7 K/uL   Lymphocytes Relative 13 %   Lymphs Abs 1.1 0.7 - 4.0 K/uL   Monocytes Relative 8 %   Monocytes Absolute 0.7 0.1 - 1.0 K/uL   Eosinophils Relative 2 %   Eosinophils Absolute 0.2 0.0 - 0.5 K/uL   Basophils Relative 1 %   Basophils Absolute 0.0 0.0 - 0.1 K/uL   Immature Granulocytes 1 %   Abs Immature Granulocytes 0.04 0.00 - 0.07 K/uL    Comment: Performed at [facility] at [facility], [address] [place], [place] [address]  Reticulocytes     Status: Abnormal   Collection Time: 12/17/22 10:43 AM  Result Value Ref Range   Retic Ct Pct 1.3 0.4 - 3.1 %   RBC. 3.62 (L) 3.87 - 5.11 MIL/uL   Retic Count, Absolute 46.7 19.0 - 186.0 K/uL   Immature Retic Fract 13.9 2.3 - 15.9 %    Comment: Performed at [facility] at [facility], [address], [place], [place] [address]      RADIOGRAPHY: No results found.     PATHOLOGY: None  ASSESSMENT/PLAN: Ms. Masri  is a very pleasant 88 yo caucasian female with history of anemia. 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  We will give her Monoferric today.  Epo level is pending. She may also benefit from an ESA.  CBC and blood smear were reviewed with Dr. Marin Olp. No abnormality outside of iron deficiency noted. No evidence of malignancy noted at this time.  Follow-up in 4 weeks.   All questions were answered. The patient knows to call the clinic with any problems, questions or concerns. We can certainly see the patient much sooner if  necessary.  The patient was discussed with Dr. Marin Olp and he is in agreement with the aforementioned.   Lottie Dawson, NP

## 2022-12-18 LAB — ERYTHROPOIETIN: Erythropoietin: 42.2 m[IU]/mL — ABNORMAL HIGH (ref 2.6–18.5)

## 2023-01-01 ENCOUNTER — Other Ambulatory Visit: Payer: Medicare Other

## 2023-01-17 ENCOUNTER — Inpatient Hospital Stay (HOSPITAL_BASED_OUTPATIENT_CLINIC_OR_DEPARTMENT_OTHER): Payer: Medicare Other | Admitting: Family

## 2023-01-17 ENCOUNTER — Other Ambulatory Visit: Payer: Self-pay | Admitting: Family

## 2023-01-17 ENCOUNTER — Inpatient Hospital Stay: Payer: Medicare Other | Attending: Hematology & Oncology

## 2023-01-17 ENCOUNTER — Inpatient Hospital Stay: Payer: Medicare Other

## 2023-01-17 ENCOUNTER — Other Ambulatory Visit: Payer: Self-pay

## 2023-01-17 ENCOUNTER — Encounter: Payer: Self-pay | Admitting: Family

## 2023-01-17 VITALS — BP 182/88 | HR 86 | Temp 98.3°F | Resp 19 | Ht 59.0 in

## 2023-01-17 VITALS — BP 171/68 | HR 81 | Resp 17

## 2023-01-17 DIAGNOSIS — D509 Iron deficiency anemia, unspecified: Secondary | ICD-10-CM | POA: Diagnosis not present

## 2023-01-17 DIAGNOSIS — D649 Anemia, unspecified: Secondary | ICD-10-CM

## 2023-01-17 LAB — CBC WITH DIFFERENTIAL (CANCER CENTER ONLY)
Abs Immature Granulocytes: 0.02 10*3/uL (ref 0.00–0.07)
Basophils Absolute: 0 10*3/uL (ref 0.0–0.1)
Basophils Relative: 0 %
Eosinophils Absolute: 0.1 10*3/uL (ref 0.0–0.5)
Eosinophils Relative: 1 %
HCT: 34.6 % — ABNORMAL LOW (ref 36.0–46.0)
Hemoglobin: 10.3 g/dL — ABNORMAL LOW (ref 12.0–15.0)
Immature Granulocytes: 0 %
Lymphocytes Relative: 12 %
Lymphs Abs: 1 10*3/uL (ref 0.7–4.0)
MCH: 24.3 pg — ABNORMAL LOW (ref 26.0–34.0)
MCHC: 29.8 g/dL — ABNORMAL LOW (ref 30.0–36.0)
MCV: 81.8 fL (ref 80.0–100.0)
Monocytes Absolute: 0.7 10*3/uL (ref 0.1–1.0)
Monocytes Relative: 9 %
Neutro Abs: 6.2 10*3/uL (ref 1.7–7.7)
Neutrophils Relative %: 78 %
Platelet Count: 339 10*3/uL (ref 150–400)
RBC: 4.23 MIL/uL (ref 3.87–5.11)
RDW: 22.8 % — ABNORMAL HIGH (ref 11.5–15.5)
WBC Count: 8.1 10*3/uL (ref 4.0–10.5)
nRBC: 0 % (ref 0.0–0.2)

## 2023-01-17 LAB — CMP (CANCER CENTER ONLY)
ALT: <5 U/L (ref 0–44)
AST: 11 U/L — ABNORMAL LOW (ref 15–41)
Albumin: 3.7 g/dL (ref 3.5–5.0)
Alkaline Phosphatase: 69 U/L (ref 38–126)
Anion gap: 12 (ref 5–15)
BUN: 13 mg/dL (ref 8–23)
CO2: 27 mmol/L (ref 22–32)
Calcium: 9.8 mg/dL (ref 8.9–10.3)
Chloride: 105 mmol/L (ref 98–111)
Creatinine: 0.82 mg/dL (ref 0.44–1.00)
GFR, Estimated: >60 mL/min (ref >60)
Glucose, Bld: 118 mg/dL — ABNORMAL HIGH (ref 70–99)
Potassium: 3.2 mmol/L — ABNORMAL LOW (ref 3.5–5.1)
Sodium: 144 mmol/L (ref 135–145)
Total Bilirubin: 0.6 mg/dL (ref 0.3–1.2)
Total Protein: 7.2 g/dL (ref 6.5–8.1)

## 2023-01-17 LAB — IRON AND IRON BINDING CAPACITY (CC-WL,HP ONLY)
Iron: 25 ug/dL — ABNORMAL LOW (ref 28–170)
Saturation Ratios: 13 % (ref 10.4–31.8)
TIBC: 193 ug/dL — ABNORMAL LOW (ref 250–450)
UIBC: 168 ug/dL (ref 148–442)

## 2023-01-17 LAB — FERRITIN: Ferritin: 189 ng/mL (ref 11–307)

## 2023-01-17 LAB — RETICULOCYTES
Immature Retic Fract: 9.1 % (ref 2.3–15.9)
RBC.: 4.21 MIL/uL (ref 3.87–5.11)
Retic Count, Absolute: 30.7 10*3/uL (ref 19.0–186.0)
Retic Ct Pct: 0.7 % (ref 0.4–3.1)

## 2023-01-17 MED ORDER — SODIUM CHLORIDE 0.9 % IV SOLN: INTRAVENOUS | Status: DC

## 2023-01-17 MED ORDER — SODIUM CHLORIDE 0.9 % IV SOLN
1000.0000 mg | Freq: Once | INTRAVENOUS | Status: AC
  Administered 2023-01-17: 1000 mg via INTRAVENOUS
  Filled 2023-01-17: qty 10

## 2023-01-17 NOTE — Progress Notes (Signed)
Hematology and Oncology Follow Up Visit  Deborah Robbins [id number] 28-Apr-1929 89 y.o. 01/17/2023   Principle Diagnosis:  Iron deficiency anemia    Of Note: Patient is Jehovah's Witness, no blood products  Current Therapy:   IV iron as indicated    Interim History:  Deborah Robbins is here today with her daughter in law for follow-up. She is feeling a little better.  She still notes some fatigue and has mild SOB with over exertion.  No fever, chills, n/v, cough, rash, dizziness, SOB, chest pain, palpitations, abdominal pain or changes in bowel or bladder habits.  No swelling, tenderness, numbness or tingling in her extremities at this time.  Skin is thin on the arms and legs. She has some bruising and hyperpigmentation on her lower extremities.  No abnormal blood loss noted. No petechiae.  No swelling noted.  No falls or syncope reported.  Appetite and hydration are good. Patient did not want to stand for a weight today.   ECOG Performance Status: 2 - Symptomatic, <50% confined to bed  Medications:  Allergies as of 01/17/2023   No Known Allergies      Medication List        Accurate as of January 17, 2023 10:49 AM. If you have any questions, ask your nurse or doctor.          ALPRAZolam 0.5 MG tablet Commonly known as: XANAX Take 0.5 mg by mouth 3 (three) times daily as needed for anxiety.   Biotin 1000 MCG tablet Take 2,000 mcg by mouth daily.   BIOTIN PO Take by mouth.   busPIRone 10 MG tablet Commonly known as: BUSPAR Take 10 mg by mouth 2 (two) times daily.   cyanocobalamin 1000 MCG tablet Commonly known as: VITAMIN B12 Take 1,000 mcg by mouth daily.   cyclobenzaprine 10 MG tablet Commonly known as: FLEXERIL Take 1 tablet (10 mg total) by mouth 3 (three) times daily as needed for muscle spasms.   diltiazem 120 MG 24 hr capsule Commonly known as: CARDIZEM CD Take 120 mg by mouth daily.   ferrous sulfate 325 (65 FE) MG tablet Take 325 mg by mouth 2  (two) times daily with a meal.   gabapentin 300 MG capsule Commonly known as: NEURONTIN Take 1 capsule (300 mg total) by mouth 3 (three) times daily.   lisinopril 10 MG tablet Commonly known as: ZESTRIL Take 10 mg by mouth in the morning and at bedtime.   metoprolol succinate 25 MG 24 hr tablet Commonly known as: TOPROL-XL TAKE 1/2 (ONE-HALF) TABLET BY MOUTH ONCE DAILY   mupirocin ointment 2 % Commonly known as: BACTROBAN Apply topically.   nitrofurantoin 100 MG capsule Commonly known as: MACRODANTIN Take by mouth.   omeprazole 20 MG capsule Commonly known as: PRILOSEC Take 20 mg by mouth daily.   PreserVision AREDS 2 Chew Chew 1 tablet by mouth 2 (two) times daily.   senna 8.6 MG tablet Commonly known as: SENOKOT Take by mouth.   traMADol 50 MG tablet Commonly known as: ULTRAM Take 1 tablet (50 mg total) by mouth every 8 (eight) hours as needed for moderate pain.   Tymlos 3120 MCG/1.56ML Sopn Generic drug: Abaloparatide Inject 80 mcg into the skin daily.   Vitamin D3 50 MCG (2000 UT) capsule Take 2,000 Units by mouth daily.   D3-1000 PO Take by mouth.        Allergies: No Known Allergies  Past Medical History, Surgical history, Social history, and Family History were reviewed and updated.  Review of Systems: All other 10 point review of systems is negative.   Physical Exam:  vitals were not taken for this visit.   Wt Readings from Last 3 Encounters:  11/07/22 129 lb (58.5 kg)  10/03/22 139 lb (63 kg)  05/06/22 139 lb (63 kg)    Ocular: Sclerae unicteric, pupils equal, round and reactive to light Ear-nose-throat: Oropharynx clear, dentition fair Lymphatic: No cervical or supraclavicular adenopathy Lungs no rales or rhonchi, good excursion bilaterally Heart regular rate and rhythm, no murmur appreciated Abd soft, nontender, positive bowel sounds MSK no focal spinal tenderness, no joint edema Neuro: non-focal, well-oriented, appropriate  affect Breasts: Deferred   Lab Results  Component Value Date   WBC 8.1 01/17/2023   HGB 10.3 (L) 01/17/2023   HCT 34.6 (L) 01/17/2023   MCV 81.8 01/17/2023   PLT 339 01/17/2023   Lab Results  Component Value Date   FERRITIN 11 12/17/2022   IRON 16 (L) 12/17/2022   TIBC 314 12/17/2022   UIBC 298 12/17/2022   IRONPCTSAT 5 (L) 12/17/2022   Lab Results  Component Value Date   RETICCTPCT 0.7 01/17/2023   RBC 4.23 01/17/2023   RBC 4.21 01/17/2023   RETICCTABS 34,080 08/11/2017   No results found for: "KPAFRELGTCHN", "LAMBDASER", "KAPLAMBRATIO" No results found for: "IGGSERUM", "IGA", "IGMSERUM" No results found for: "TOTALPROTELP", "ALBUMINELP", "A1GS", "A2GS", "BETS", "BETA2SER", "GAMS", "MSPIKE", "SPEI"   Chemistry      Component Value Date/Time   NA 141 12/17/2022 1042   K 3.1 (L) 12/17/2022 1042   CL 106 12/17/2022 1042   CO2 24 12/17/2022 1042   BUN 14 12/17/2022 1042   CREATININE 0.76 12/17/2022 1042   CREATININE 0.96 (H) 11/07/2022 0000      Component Value Date/Time   CALCIUM 8.8 (L) 12/17/2022 1042   ALKPHOS 68 12/17/2022 1042   AST 12 (L) 12/17/2022 1042   ALT <5 12/17/2022 1042   BILITOT 0.5 12/17/2022 1042       Impression and Plan: Deborah Robbins is a very pleasant 89 yo caucasian female with history of anemia. 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  IV iron given today as planned.  Follow-up in 3 months.   Lottie Dawson, NP 1/19/202410:49 AM

## 2023-01-17 NOTE — Patient Instructions (Signed)

## 2023-02-10 ENCOUNTER — Ambulatory Visit: Payer: Medicare Other | Admitting: Family Medicine

## 2023-02-11 DIAGNOSIS — I509 Heart failure, unspecified: Secondary | ICD-10-CM | POA: Insufficient documentation

## 2023-02-18 DIAGNOSIS — I272 Pulmonary hypertension, unspecified: Secondary | ICD-10-CM | POA: Insufficient documentation

## 2023-02-27 ENCOUNTER — Ambulatory Visit (INDEPENDENT_AMBULATORY_CARE_PROVIDER_SITE_OTHER): Payer: Medicare Other | Admitting: Family Medicine

## 2023-02-27 ENCOUNTER — Ambulatory Visit (INDEPENDENT_AMBULATORY_CARE_PROVIDER_SITE_OTHER): Payer: Medicare Other

## 2023-02-27 ENCOUNTER — Encounter: Payer: Self-pay | Admitting: Family Medicine

## 2023-02-27 VITALS — BP 146/57 | HR 88

## 2023-02-27 DIAGNOSIS — D692 Other nonthrombocytopenic purpura: Secondary | ICD-10-CM | POA: Diagnosis not present

## 2023-02-27 DIAGNOSIS — R29898 Other symptoms and signs involving the musculoskeletal system: Secondary | ICD-10-CM | POA: Diagnosis not present

## 2023-02-27 DIAGNOSIS — M898X5 Other specified disorders of bone, thigh: Secondary | ICD-10-CM

## 2023-02-27 DIAGNOSIS — R21 Rash and other nonspecific skin eruption: Secondary | ICD-10-CM

## 2023-02-27 DIAGNOSIS — E785 Hyperlipidemia, unspecified: Secondary | ICD-10-CM | POA: Insufficient documentation

## 2023-02-27 MED ORDER — FLUCONAZOLE 150 MG PO TABS: 150.0000 mg | ORAL_TABLET | ORAL | 0 refills | Status: DC

## 2023-02-27 MED ORDER — CLOTRIMAZOLE-BETAMETHASONE 1-0.05 % EX CREA: 1.0000 | TOPICAL_CREAM | Freq: Two times a day (BID) | CUTANEOUS | 1 refills | Status: DC

## 2023-02-27 NOTE — Progress Notes (Signed)
Acute Office Visit  Subjective:     Patient ID: Deborah Robbins, female    DOB: 1929-05-20, 89 y.o.   MRN: [id number]  Chief Complaint  Patient presents with   Vaginal Pain    HPI Patient is in today for left groin pain is been tender and painful.  She feels like it is on the left groin crease area towards the labia.  It has been bothersome for about 3 days.  She also wanted me to look at her left hip.  She had a helper who was trying to get her into the [place] and her legs got tangled up and she had intense pain.  Since then she has had pain over that left outer femur and she has been having difficulty with hip flexion.  She says she has to take her hands to lift that leg up but she is able to easily flex her right hip.  ROS      Objective:    BP (!) 146/57 (BP Location: Left Arm, Patient Position: Sitting, Cuff Size: Normal)   Pulse 88   SpO2 98%    Physical Exam Vitals reviewed.  Constitutional:      Appearance: She is well-developed.  HENT:     Head: Normocephalic and atraumatic.  Eyes:     Conjunctiva/sclera: Conjunctivae normal.  Cardiovascular:     Rate and Rhythm: Normal rate.  Pulmonary:     Effort: Pulmonary effort is normal.  Musculoskeletal:     Comments: Tender over the trochanteric bursa.  Unable to flex her left hip against gravity but she was able to lift that leg using her hands.  Tender along the upper outer femur on the left.  Trace swelling of the left knee.  Significant bruising from the knees down on both legs.  Skin:    General: Skin is dry.     Coloration: Skin is not pale.  Neurological:     Mental Status: She is alert and oriented to person, place, and time.  Psychiatric:        Behavior: Behavior normal.     No results found for any visits on 02/27/23.      Assessment & Plan:   Problem List Items Addressed This Visit   None Visit Diagnoses     Pain of left femur    -  Primary   Relevant Medications   clotrimazole-betamethasone  (LOTRISONE) cream   fluconazole (DIFLUCAN) 150 MG tablet   Other Relevant Orders   DG FEMUR MIN 2 VIEWS LEFT   DG Hip Unilat W OR W/O Pelvis 2-3 Views Left   Rash of groin       Relevant Medications   clotrimazole-betamethasone (LOTRISONE) cream   fluconazole (DIFLUCAN) 150 MG tablet   Weakness of left hip       Relevant Medications   clotrimazole-betamethasone (LOTRISONE) cream   fluconazole (DIFLUCAN) 150 MG tablet   Other Relevant Orders   DG FEMUR MIN 2 VIEWS LEFT   DG Hip Unilat W OR W/O Pelvis 2-3 Views Left   Senile purpura (HCC)          Pain of the right femur-trauma/injury 3 weeks ago.  Will get plain films today I am also concerned that she is not able to flex her left hip against gravity.  So we will get a plain film of the hip as well will call with results once available.  Rash of the right groin-I think she just has some skin irritation from the  moisture and adult diapers.  I am going to treat her with oral Diflucan and give her Lotrisone cream to put on the area.  If not improving over the next week please let us know.  He now purpura she has significant bruising over her lower legs and her forearms.  It is really quite impressive.   Meds ordered this encounter  Medications   clotrimazole-betamethasone (LOTRISONE) cream    Sig: Apply 1 Application topically 2 (two) times daily.    Dispense:  30 g    Refill:  1   fluconazole (DIFLUCAN) 150 MG tablet    Sig: Take 1 tablet (150 mg total) by mouth every other day.    Dispense:  2 tablet    Refill:  0    No follow-ups on file.  Beatrice Lecher, MD

## 2023-03-03 NOTE — Progress Notes (Signed)
Hi Deborah Robbins, x-ray looks okay in regards to no sign of fracture or dislocation.  There is some arthritis.  There is also some arthritis in your left knee.  Do think you would benefit from some formal physical therapy if you feel like that would be doable or helpful then please let us know and we can get you scheduled.

## 2023-03-27 ENCOUNTER — Telehealth: Payer: Self-pay

## 2023-03-27 NOTE — Telephone Encounter (Signed)
Rcvd vm from [facility] @ [place] [phone]) stating Ms. Brekke passed away on Mar 30, 2023 @ 1243am. She was on [facility] referred by [place]: Please update/close her chart. Thank you

## 2023-03-31 DEATH — deceased

## 2023-04-14 ENCOUNTER — Ambulatory Visit: Payer: Medicare Other | Admitting: Family

## 2023-04-14 ENCOUNTER — Inpatient Hospital Stay: Payer: Medicare Other
